# Patient Record
Sex: Female | Born: 1985
Health system: Southern US, Community
[De-identification: ages and names within clinical notes are randomized; demographics above are authoritative.]

## PROBLEM LIST (undated history)

## (undated) DIAGNOSIS — M419 Scoliosis, unspecified: Secondary | ICD-10-CM

## (undated) DIAGNOSIS — R4020 Unspecified coma: Secondary | ICD-10-CM

## (undated) DIAGNOSIS — E162 Hypoglycemia, unspecified: Secondary | ICD-10-CM

## (undated) DIAGNOSIS — J45909 Unspecified asthma, uncomplicated: Secondary | ICD-10-CM

## (undated) DIAGNOSIS — E7800 Pure hypercholesterolemia, unspecified: Secondary | ICD-10-CM

## (undated) HISTORY — PX: GALLBLADDER SURGERY: SHX652

## (undated) HISTORY — PX: WISDOM TOOTH EXTRACTION: SHX21

## (undated) HISTORY — PX: TONSILLECTOMY: SUR1361

## (undated) HISTORY — DX: Pure hypercholesterolemia, unspecified: E78.00

## (undated) HISTORY — PX: ABDOMINAL HYSTERECTOMY: SHX81

## (undated) HISTORY — DX: Unspecified coma: R40.20

---

## 2017-07-21 ENCOUNTER — Encounter (HOSPITAL_COMMUNITY): Payer: Self-pay | Admitting: Family Medicine

## 2017-07-21 ENCOUNTER — Ambulatory Visit (HOSPITAL_COMMUNITY)
Admission: EM | Admit: 2017-07-21 | Discharge: 2017-07-21 | Disposition: A | Discharge status: Home / Self Care | Payer: BLUE CROSS/BLUE SHIELD | Attending: Family Medicine | Admitting: Family Medicine

## 2017-07-21 DIAGNOSIS — M79661 Pain in right lower leg: Secondary | ICD-10-CM | POA: Diagnosis not present

## 2017-07-21 NOTE — ED Provider Notes (Signed)
[facility]    CSN: [id number] Arrival date & time: 07/21/17  1121     History   Chief Complaint Chief Complaint  Patient presents with  . Leg Pain    HPI Anne Eaton is a 32 y.o. female no significant past medical history presenting today with right calf pain.  States that she woke up yesterday morning with sharp pain that feels like a scalpel is running down the side of her calf.  She is also endorsing swelling.  Did feel like it was increasingly warm last night, but denies any redness.  Denies history of previous clot.  Does note that she has had issues with her right leg off and on since she was injured on the job.  Occasionally she will have right knee swelling without pain that resolves on its.  She denies any injury prior to onset of calf pain, denies any recent exercise.  Feels this pain is different from a muscle soreness.  Patient does have a family history of DVTs in her grandmother.  Patient is a smoker.  Denies birth control use, personal history of cancer, recent surgery, immobilization.   HPI  History reviewed. No pertinent past medical history.  There are no active problems to display for this patient.   History reviewed. No pertinent surgical history.  OB History    No data available       Home Medications    Prior to Admission medications   Not on File    Family History History reviewed. No pertinent family history.  Social History Social History   Tobacco Use  . Smoking status: Current Every Day Smoker  . Smokeless tobacco: Former Engineer, water Use Topics  . Alcohol use: Not on file  . Drug use: Not on file     Allergies   Patient has no known allergies.   Review of Systems Review of Systems  Constitutional: Negative for activity change, chills and fever.  Respiratory: Negative for cough and shortness of breath.   Cardiovascular: Positive for leg swelling. Negative for chest pain and palpitations.  Gastrointestinal:  Negative for abdominal pain, nausea and vomiting.  Musculoskeletal: Positive for myalgias. Negative for arthralgias and back pain.  Skin: Negative for color change and rash.  Neurological: Negative for dizziness, syncope, weakness and headaches.  All other systems reviewed and are negative.    Physical Exam Triage Vital Signs ED Triage Vitals  Enc Vitals Group     BP 07/21/17 1219 (!) 133/49     Pulse Rate 07/21/17 1219 83     Resp 07/21/17 1219 18     Temp 07/21/17 1219 98.5 F (36.9 C)     Temp src --      SpO2 07/21/17 1219 100 %     Weight --      Height --      Head Circumference --      Peak Flow --      Pain Score 07/21/17 1217 5     Pain Loc --      Pain Edu? --      Excl. in GC? --    No data found.  Updated Vital Signs BP (!) 133/49   Pulse 83   Temp 98.5 F (36.9 C)   Resp 18   SpO2 100%   Visual Acuity Right Eye Distance:   Left Eye Distance:   Bilateral Distance:    Right Eye Near:   Left Eye Near:    Bilateral  Near:     Physical Exam  Constitutional: She appears well-developed and well-nourished. No distress.  HENT:  Head: Normocephalic and atraumatic.  Eyes: Conjunctivae are normal.  Neck: Neck supple.  Cardiovascular: Normal rate.  Pulmonary/Chest: Effort normal. No respiratory distress.  Musculoskeletal: She exhibits no edema.  Mild swelling to right calf, tenderness to palpation of medial aspect of calf and linear distribution towards ankle.  No erythema or palpable cord.  Nontender to palpation of the knee or ankle.  Dorsalis pedis 2+.  Toes are cold, cap refill less than 2 seconds.  Neurological: She is alert.  Skin: Skin is warm and dry.  Psychiatric: She has a normal mood and affect.  Nursing note and vitals reviewed.    UC Treatments / Results  Labs (all labs ordered are listed, but only abnormal results are displayed) Labs Reviewed - No data to display  EKG  EKG Interpretation None       Radiology No results  found.  Procedures Procedures (including critical care time)  Medications Ordered in UC Medications - No data to display   Initial Impression / Assessment and Plan / UC Course  I have reviewed the triage vital signs and the nursing notes.  Pertinent labs & imaging results that were available during my care of the patient were reviewed by me and considered in my medical decision making (see chart for details).     Given unilateral leg swelling and calf tenderness; risk factors of smoking and family history will send for ultrasound for evaluation for DVT as cause of pain.  Positive was sent to emergency room.  If negative, will treat conservatively with anti-inflammatories and compression. Discussed strict return precautions. Patient verbalized understanding and is agreeable with plan.   Final Clinical Impressions(s) / UC Diagnoses   Final diagnoses:  Right calf pain    ED Discharge Orders        Ordered    VAS US LOWER EXTREMITY VENOUS (DVT)     07/21/17 1306       Controlled Substance Prescriptions [place] Controlled Substance Registry consulted? Not Applicable   Lew DawesWieters, [place] Nunley C, [place]-C 07/21/17 1322

## 2017-07-21 NOTE — ED Notes (Signed)
Called vascular lab-left message

## 2017-07-21 NOTE — Discharge Instructions (Signed)
Please go to the main entrance of [facility] at      and ask for the vascular lab.  If there is a clot- please go to emergency room.   If ultrasound is clear for clot I would take tylenol and/or ibuprofen, use compression dressings. Return if not improving on own in 1-2 weeks, or sooner if worsening and developing other symptoms.

## 2017-07-21 NOTE — ED Notes (Signed)
Patient waiting in lobby for instructions and appt time

## 2017-07-21 NOTE — ED Triage Notes (Addendum)
Pt here for RLE pain in calf and lateral part of leg that started yesterday morning. Hx of easy bruising and knee swelling in the same leg. No strenuous activity. Sitting at home most of the time. Pt has family hx DVT.

## 2017-07-21 NOTE — ED Notes (Signed)
[facility] PT made aware. PT instructed to present to ED for any chest pain or shortness of breath.

## 2017-07-21 NOTE — ED Notes (Signed)
Message left for vascular, waiting for return

## 2017-07-22 ENCOUNTER — Ambulatory Visit (HOSPITAL_COMMUNITY)
Admit: 2017-07-22 | Discharge: 2017-07-22 | Disposition: A | Discharge status: Home / Self Care | Payer: BLUE CROSS/BLUE SHIELD | Attending: Emergency Medicine | Admitting: Emergency Medicine

## 2017-07-22 DIAGNOSIS — M7989 Other specified soft tissue disorders: Secondary | ICD-10-CM | POA: Diagnosis not present

## 2017-07-22 DIAGNOSIS — M79604 Pain in right leg: Secondary | ICD-10-CM | POA: Insufficient documentation

## 2017-07-22 DIAGNOSIS — M79609 Pain in unspecified limb: Secondary | ICD-10-CM

## 2017-07-22 NOTE — Progress Notes (Addendum)
Right lower extremity venous duplex completed. There is no evidence of a DVT, superficial thrombosis, or Baker's cyst. Anne Eaton, RVS 07/22/2017, 10:14 AM

## 2018-04-18 DIAGNOSIS — Z5321 Procedure and treatment not carried out due to patient leaving prior to being seen by health care provider: Secondary | ICD-10-CM | POA: Diagnosis not present

## 2018-04-18 DIAGNOSIS — R1012 Left upper quadrant pain: Secondary | ICD-10-CM | POA: Insufficient documentation

## 2018-04-19 ENCOUNTER — Other Ambulatory Visit: Payer: Self-pay

## 2018-04-19 ENCOUNTER — Emergency Department (HOSPITAL_COMMUNITY)
Admission: EM | Admit: 2018-04-19 | Discharge: 2018-04-19 | Disposition: A | Discharge status: Home / Self Care | Payer: Medicaid Other | Source: Home / Self Care | Attending: Emergency Medicine | Admitting: Emergency Medicine

## 2018-04-19 ENCOUNTER — Encounter (HOSPITAL_COMMUNITY): Payer: Self-pay | Admitting: Emergency Medicine

## 2018-04-19 ENCOUNTER — Emergency Department (HOSPITAL_COMMUNITY)
Admission: EM | Admit: 2018-04-19 | Discharge: 2018-04-19 | Disposition: A | Discharge status: Home / Self Care | Payer: Medicaid Other | Attending: Emergency Medicine | Admitting: Emergency Medicine

## 2018-04-19 ENCOUNTER — Encounter (HOSPITAL_COMMUNITY): Payer: Self-pay

## 2018-04-19 ENCOUNTER — Emergency Department (HOSPITAL_COMMUNITY): Payer: Medicaid Other

## 2018-04-19 DIAGNOSIS — R1012 Left upper quadrant pain: Secondary | ICD-10-CM | POA: Insufficient documentation

## 2018-04-19 DIAGNOSIS — F17210 Nicotine dependence, cigarettes, uncomplicated: Secondary | ICD-10-CM

## 2018-04-19 HISTORY — DX: Unspecified asthma, uncomplicated: J45.909

## 2018-04-19 HISTORY — DX: Hypoglycemia, unspecified: E16.2

## 2018-04-19 LAB — COMPREHENSIVE METABOLIC PANEL
ALK PHOS: 70 U/L (ref 38–126)
ALT: 21 U/L (ref 0–44)
AST: 4 U/L — ABNORMAL LOW (ref 15–41)
Albumin: 3.9 g/dL (ref 3.5–5.0)
Anion gap: 6 (ref 5–15)
BILIRUBIN TOTAL: 0.5 mg/dL (ref 0.3–1.2)
BUN: 15 mg/dL (ref 6–20)
CALCIUM: 9.1 mg/dL (ref 8.9–10.3)
CO2: 25 mmol/L (ref 22–32)
Chloride: 107 mmol/L (ref 98–111)
Creatinine, Ser: 0.66 mg/dL (ref 0.44–1.00)
GFR calc Af Amer: >60 mL/min (ref >60)
Glucose, Bld: 110 mg/dL — ABNORMAL HIGH (ref 70–99)
POTASSIUM: 3.7 mmol/L (ref 3.5–5.1)
Sodium: 138 mmol/L (ref 135–145)
TOTAL PROTEIN: 7 g/dL (ref 6.5–8.1)

## 2018-04-19 LAB — I-STAT BETA HCG BLOOD, ED (MC, WL, AP ONLY)

## 2018-04-19 LAB — CBC
HCT: 44.2 % (ref 36.0–46.0)
Hemoglobin: 14.6 g/dL (ref 12.0–15.0)
MCH: 31.9 pg (ref 26.0–34.0)
MCHC: 33 g/dL (ref 30.0–36.0)
MCV: 96.7 fL (ref 80.0–100.0)
PLATELETS: 309 10*3/uL (ref 150–400)
RBC: 4.57 MIL/uL (ref 3.87–5.11)
RDW: 12.1 % (ref 11.5–15.5)
WBC: 11 10*3/uL — AB (ref 4.0–10.5)
nRBC: 0 % (ref 0.0–0.2)

## 2018-04-19 LAB — URINALYSIS, ROUTINE W REFLEX MICROSCOPIC
BILIRUBIN URINE: NEGATIVE
Glucose, UA: NEGATIVE mg/dL
Hgb urine dipstick: NEGATIVE
KETONES UR: NEGATIVE mg/dL
Leukocytes, UA: NEGATIVE
NITRITE: NEGATIVE
Protein, ur: NEGATIVE mg/dL
Specific Gravity, Urine: 1.01 (ref 1.005–1.030)
pH: 5 (ref 5.0–8.0)

## 2018-04-19 LAB — LIPASE, BLOOD: Lipase: 45 U/L (ref 11–51)

## 2018-04-19 MED ORDER — LORAZEPAM 2 MG/ML IJ SOLN
1.0000 mg | Freq: Once | INTRAMUSCULAR | Status: AC
  Administered 2018-04-19: 1 mg via INTRAVENOUS
  Filled 2018-04-19: qty 1

## 2018-04-19 MED ORDER — PANTOPRAZOLE SODIUM 40 MG PO TBEC: 40.0000 mg | DELAYED_RELEASE_TABLET | Freq: Every day | ORAL | 0 refills | Status: DC

## 2018-04-19 MED ORDER — PANTOPRAZOLE SODIUM 40 MG IV SOLR
40.0000 mg | Freq: Once | INTRAVENOUS | Status: AC
  Administered 2018-04-19: 40 mg via INTRAVENOUS
  Filled 2018-04-19: qty 40

## 2018-04-19 MED ORDER — IOHEXOL 300 MG/ML  SOLN
100.0000 mL | Freq: Once | INTRAMUSCULAR | Status: AC | PRN
  Administered 2018-04-19: 100 mL via INTRAVENOUS

## 2018-04-19 MED ORDER — FENTANYL CITRATE (PF) 100 MCG/2ML IJ SOLN
100.0000 ug | INTRAMUSCULAR | Status: DC | PRN
  Administered 2018-04-19: 100 ug via INTRAVENOUS
  Filled 2018-04-19: qty 2

## 2018-04-19 MED ORDER — SODIUM CHLORIDE 0.9 % IV SOLN
INTRAVENOUS | Status: DC
  Administered 2018-04-19: 12:00:00 via INTRAVENOUS

## 2018-04-19 MED ORDER — KETOROLAC TROMETHAMINE 30 MG/ML IJ SOLN
15.0000 mg | Freq: Once | INTRAMUSCULAR | Status: AC
  Administered 2018-04-19: 15 mg via INTRAVENOUS
  Filled 2018-04-19: qty 1

## 2018-04-19 MED ORDER — SODIUM CHLORIDE 0.9 % IV BOLUS
500.0000 mL | Freq: Once | INTRAVENOUS | Status: AC
  Administered 2018-04-19: 500 mL via INTRAVENOUS

## 2018-04-19 NOTE — ED Triage Notes (Signed)
Pt states sharp left ULQ underneath rib radiated up back, down side, and cannot take deep breaths that started approx 3 hours ago. Has taken Prilosec and zantac approx 3 hours ago with no relief.

## 2018-04-19 NOTE — ED Triage Notes (Signed)
Patient here for evaluation for LUQ radiating to left chest and breast. States hard to take deep inspirations. Happened in the past associated with acid reflux. Seen at [facility] ED however states left without being seen due to waiting.

## 2018-04-19 NOTE — Discharge Instructions (Addendum)
Use Tylenol as needed for pain.  We are prescribing a proton pump inhibitor to decrease acid secretion.  It would likely help to take an antacid like Maalox or Tums, before meals and at bedtime for a week or 2, until this medicine start to work.  Follow-up with your doctor of your choice for further evaluation and treatment, as needed.

## 2018-04-19 NOTE — ED Notes (Signed)
Patient verbalizes understanding of discharge instructions. Opportunity for questioning and answers were provided. 

## 2018-04-19 NOTE — ED Notes (Signed)
Rn went into room to start an iv on pt and draw blood work, pt sitting on side of stretcher, refused to have iv started, did agree to having blood work performed, Charity fundraiserN explained that I would try to draw blood work and iv in one stick but pt still refused,

## 2018-04-19 NOTE — ED Provider Notes (Addendum)
[facility]   CSN: [id number] Arrival date & time: 04/19/18  [id number]     History   Chief Complaint Chief Complaint  Patient presents with  . Chest Pain  . Abdominal Pain  . Shortness of Breath    HPI Anne Eaton is a 32 y.o. female.  HPI   She presents for evaluation of left upper quadrant abdominal pain which started last night after eating dinner.  She went to [facility] emergency department last night, had screening labs ordered, but was not seen by provider before she left before evaluation.  She is sad to come here today because the pain persisted.  She has had similar pain in the past.  She had a bowel movement at 10:30 PM last night that was normal.  She denies cough, shortness of breath, chest pain, weakness or dizziness.  There are no other no modifying factors.  Past Medical History:  Diagnosis Date  . Asthma   . Hypoglycemia     There are no active problems to display for this patient.   Past Surgical History:  Procedure Laterality Date  . ABDOMINAL HYSTERECTOMY    . CESAREAN SECTION    . GALLBLADDER SURGERY    . TONSILLECTOMY    . WISDOM TOOTH EXTRACTION       OB History   None      Home Medications    Prior to Admission medications   Medication Sig Start Date End Date Taking? Authorizing Provider  pantoprazole (PROTONIX) 40 MG tablet Take 1 tablet (40 mg total) by mouth daily. 04/19/18   Mancel BaleWentz, Dahl Higinbotham, MD    Family History No family history on file.  Social History Social History   Tobacco Use  . Smoking status: Current Every Day Smoker    Packs/day: 1.00  . Smokeless tobacco: Former Engineer, waterUser  Substance Use Topics  . Alcohol use: Yes    Comment: occ  . Drug use: Never     Allergies   Augmentin [amoxicillin-pot clavulanate] and Morphine and related   Review of Systems Review of Systems  All other systems reviewed and are negative.    Physical Exam Updated Vital Signs BP  112/78   Pulse 68   Temp 98.1 F (36.7 C) (Oral)   Resp 18   Ht 5\' 5"  (1.651 m)   Wt 88.5 kg   SpO2 95%   BMI 32.45 kg/m   Physical Exam Vitals signs and nursing note reviewed.  Constitutional:      Appearance: She is well-developed. She is not ill-appearing or diaphoretic.  HENT:     Head: Normocephalic and atraumatic.  Eyes:     Conjunctiva/sclera: Conjunctivae normal.     Pupils: Pupils are equal, round, and reactive to light.  Neck:     Musculoskeletal: Normal range of motion and neck supple.     Trachea: Phonation normal.  Cardiovascular:     Rate and Rhythm: Normal rate and regular rhythm.  Pulmonary:     Effort: Pulmonary effort is normal.     Breath sounds: Normal breath sounds.  Chest:     Chest wall: No tenderness.  Abdominal:     General: There is no distension.     Palpations: Abdomen is soft.     Tenderness: There is abdominal tenderness (LLQ, mild.). There is no guarding or rebound.     Hernia: No hernia is present.  Musculoskeletal: Normal range of motion.  Skin:    General: Skin is  warm and dry.  Neurological:     Mental Status: She is alert and oriented to person, place, and time.     Motor: No abnormal muscle tone.  Psychiatric:        Behavior: Behavior normal.        Thought Content: Thought content normal.        Judgment: Judgment normal.      ED Treatments / Results  Labs (all labs ordered are listed, but only abnormal results are displayed) Labs Reviewed  URINALYSIS, ROUTINE W REFLEX MICROSCOPIC  I-STAT BETA HCG BLOOD, ED ([facility], [facility], [facility] ONLY)    EKG EKG Interpretation  Date/Time:  Monday April 19 2018 09:50:01 EST Ventricular Rate:  76 PR Interval:    QRS Duration: 77 QT Interval:  376 QTC Calculation: 423 R Axis:   65 Text Interpretation:  Ectopic atrial rhythm No previous ECGs available Confirmed by Mancel Bale [phone]) on 04/19/2018 1:22:57 PM   Radiology Dg Chest 2 View  Result Date: 04/19/2018 CLINICAL DATA:  Acute  left upper quadrant abdominal pain. EXAM: CHEST - 2 VIEW COMPARISON:  None. FINDINGS: The heart size and mediastinal contours are within normal limits. Both lungs are clear. No pneumothorax or pleural effusion is noted. The visualized skeletal structures are unremarkable. IMPRESSION: No active cardiopulmonary disease. Electronically Signed   By: Lupita Raider, M.D.   On: 04/19/2018 10:48   Ct Abdomen Pelvis W Contrast  Result Date: 04/19/2018 CLINICAL DATA:  Left-sided abdominal pain for several hours EXAM: CT ABDOMEN AND PELVIS WITH CONTRAST TECHNIQUE: Multidetector CT imaging of the abdomen and pelvis was performed using the standard protocol following bolus administration of intravenous contrast. CONTRAST:  OMNIPAQUE IOHEXOL 300 MG/ML  SOLN COMPARISON:  None. FINDINGS: Lower chest: Mild bibasilar atelectasis is noted left slightly greater than right. Hepatobiliary: No focal liver abnormality is seen. Status post cholecystectomy. No biliary dilatation. Pancreas: Unremarkable. No pancreatic ductal dilatation or surrounding inflammatory changes. Spleen: Normal in size without focal abnormality. Adrenals/Urinary Tract: The adrenal glands are within normal limits. Kidneys are well visualized without renal calculi or obstructive changes. No ureteral stones are seen. The bladder is decompressed. Stomach/Bowel: Stomach is within normal limits. Appendix appears normal. No evidence of bowel wall thickening, distention, or inflammatory changes. Vascular/Lymphatic: No significant vascular findings are present. No enlarged abdominal or pelvic lymph nodes. Reproductive: Status post hysterectomy. No adnexal masses. Other: No abdominal wall hernia or abnormality. No abdominopelvic ascites. Musculoskeletal: No acute or significant osseous findings. IMPRESSION: Mild bibasilar atelectasis left greater than right. No other focal abnormality is noted. Electronically Signed   By: Alcide Clever M.D.   On: 04/19/2018 11:40     Procedures Procedures (including critical care time)  Medications Ordered in ED Medications  0.9 %  sodium chloride infusion ( Intravenous Stopped 04/19/18 1557)  fentaNYL (SUBLIMAZE) injection 100 mcg (100 mcg Intravenous Given 04/19/18 1015)  sodium chloride 0.9 % bolus 500 mL (0 mLs Intravenous Stopped 04/19/18 1124)  pantoprazole (PROTONIX) injection 40 mg (40 mg Intravenous Given 04/19/18 1018)  iohexol (OMNIPAQUE) 300 MG/ML solution 100 mL (100 mLs Intravenous Contrast Given 04/19/18 1104)  LORazepam (ATIVAN) injection 1 mg (1 mg Intravenous Given 04/19/18 1353)  ketorolac (TORADOL) 30 MG/ML injection 15 mg (15 mg Intravenous Given 04/19/18 1353)     Initial Impression / Assessment and Plan / ED Course  I have reviewed the triage vital signs and the nursing notes.  Pertinent labs & imaging results that were available during my care of  the patient were reviewed by me and considered in my medical decision making (see chart for details).  Clinical Course as of Apr 19 1852  Mon Apr 19, 2018  1323 Normal  I-Stat beta hCG blood, ED [EW]  1323 Normal  Urinalysis, Routine w reflex microscopic [EW]  1323 No acute abnormality, images reviewed by me  CT Abdomen Pelvis W Contrast [EW]  1324 No infiltrate or CHF, images reviewed by me  DG Chest 2 View [EW]  1334 She states she had no relief of her pain after fentanyl was given.  She would like to try something else.  She continues to have left upper quadrant abdominal pain.   [EW]    Clinical Course User Index [EW] Mancel Bale, MD     Patient Vitals for the past 24 hrs:  BP Temp Temp src Pulse Resp SpO2 Height Weight  04/19/18 1516 112/78 - - 68 18 95 % - -  04/19/18 1100 - - - 70 20 94 % - -  04/19/18 1045 - - - 64 (!) 25 96 % - -  04/19/18 1030 - - - 70 14 94 % - -  04/19/18 1015 - - - 61 16 99 % - -  04/19/18 1000 - - - 73 19 94 % - -  04/19/18 0951 117/61 98.1 F (36.7 C) Oral 68 15 96 % 5\' 5"  (1.651 m) 88.5 kg     3:48 PM Reevaluation with update and discussion. After initial assessment and treatment, an updated evaluation reveals she is now comfortable after additional treatment and ready to go home.  Findings discussed with the patient all questions were answered. Mancel Bale   Medical Decision Making: Nonspecific abdominal pain reassuring ED evaluation.  No evidence for colitis, urinary tract infection, pancreatitis, serious bacterial infection or impending vascular collapse.  Possible GERD.  CRITICAL CARE-no Performed by: Mancel Bale   Nursing Notes Reviewed/ Care Coordinated Applicable Imaging Reviewed Interpretation of Laboratory Data incorporated into ED treatment  The patient appears reasonably screened and/or stabilized for discharge and I doubt any other medical condition or other [facility] requiring further screening, evaluation, or treatment in the ED at this time prior to discharge.  Plan: Home Medications-antacid of choice, for 1 or 2 weeks; Home Treatments-gradually advance diet; return here if the recommended treatment, does not improve the symptoms; Recommended follow up-PCP of choice as needed.     Final Clinical Impressions(s) / ED Diagnoses   Final diagnoses:  Left upper quadrant pain    ED Discharge Orders         Ordered    pantoprazole (PROTONIX) 40 MG tablet  Daily     04/19/18 1546           Mancel Bale, MD 04/19/18 Herbie Baltimore    Mancel Bale, MD 04/27/18 1249

## 2018-04-19 NOTE — ED Notes (Signed)
Pt stated she was unwilling to wait on reevaluation when informed MD was tied up with a critical patient. Stated she was in the room for 2 hours waiting and 'this is fucking ridiculous'. Pt told RN to 'kiss her ass and to tell the doctor to kiss her ass' as well. Left AMA with no reevaluation or discharge instructions.

## 2018-04-19 NOTE — ED Notes (Signed)
Pt c/o left upper quad abd pain that started tonight after eating spaghetti, pt states that she has had the pain numerous times in the past with the last episode several months ago,

## 2018-04-20 ENCOUNTER — Ambulatory Visit: Payer: Self-pay | Admitting: *Deleted

## 2018-04-20 NOTE — Telephone Encounter (Signed)
Pt seen in ED yesterday for left upper quad abdominal pain. Sudden onset yesterday, 10/10. States pain is "No better." Reports SOB. ED report reads:  Mild bibasilar atelectasis left greater than right. No other focal abnormality is noted.  WBCs 11.0 Pt discharged with Protonix, which she states she has been taking.    Pt tearful during call. States, "Awake all night with pain." Pt directed back to ED. Verbalizes understanding.  Prior to triage, agent secured new patient appt. with Dr. Creta LevinStallings for 06/04/17, placed on wait list.   Reason for Disposition . [1] SEVERE pain (e.g., excruciating) AND [2] present > 1 hour  Answer Assessment - Initial Assessment Questions 1. LOCATION: "Where does it hurt?"      Left upper quad of abdomen, "Right below rib cage" 2. RADIATION: "Does the pain shoot anywhere else?" (e.g., chest, back)     *BAck 3. ONSET: "When did the pain begin?" (e.g., minutes, hours or days ago)     Yesterday 4. SUDDEN: "Gradual or sudden onset?"     Sudden 5. PATTERN "Does the pain come and go, or is it constant?"    - If constant: "Is it getting better, staying the same, or worsening?"      (Note: Constant means the pain never goes away completely; most serious pain is constant and it progresses)     - If intermittent: "How long does it last?" "Do you have pain now?"     (Note: Intermittent means the pain goes away completely between bouts)     Constant 6. SEVERITY: "How bad is the pain?"  (e.g., Scale 1-10; mild, moderate, or severe)    - MILD (1-3): doesn't interfere with normal activities, abdomen soft and not tender to touch     - MODERATE (4-7): interferes with normal activities or awakens from sleep, tender to touch     - SEVERE (8-10): excruciating pain, doubled over, unable to do any normal activities       10/10 7. RECURRENT SYMPTOM: "Have you ever had this type of abdominal pain before?" If so, ask: "When was the last time?" and "What happened that time?"      Seen  in ED yesterday 8. AGGRAVATING FACTORS: "Does anything seem to cause this pain?" (e.g., foods, stress, alcohol)      9. CARDIAC SYMPTOMS: "Do you have any of the following symptoms: chest pain, difficulty breathing, sweating, nausea?"      10. OTHER SYMPTOMS: "Do you have any other symptoms?" (e.g., fever, vomiting, diarrhea)       SOB 11. PREGNANCY: "Is there any chance you are pregnant?" "When was your last menstrual period?"  Protocols used: ABDOMINAL PAIN - UPPER-A-AH

## 2018-04-21 NOTE — Telephone Encounter (Signed)
FYI

## 2018-05-26 ENCOUNTER — Encounter (HOSPITAL_COMMUNITY): Payer: Self-pay

## 2018-05-26 ENCOUNTER — Emergency Department (HOSPITAL_COMMUNITY)
Admission: EM | Admit: 2018-05-26 | Discharge: 2018-05-26 | Disposition: A | Discharge status: Home / Self Care | Payer: Medicaid Other | Attending: Emergency Medicine | Admitting: Emergency Medicine

## 2018-05-26 ENCOUNTER — Other Ambulatory Visit: Payer: Self-pay

## 2018-05-26 ENCOUNTER — Ambulatory Visit (HOSPITAL_COMMUNITY)
Admission: EM | Admit: 2018-05-26 | Discharge: 2018-05-26 | Disposition: A | Discharge status: Other Acute Inpatient Hospital | Payer: Self-pay | Source: Ambulatory Visit

## 2018-05-26 ENCOUNTER — Emergency Department (HOSPITAL_COMMUNITY): Payer: Medicaid Other

## 2018-05-26 DIAGNOSIS — J45909 Unspecified asthma, uncomplicated: Secondary | ICD-10-CM | POA: Diagnosis not present

## 2018-05-26 DIAGNOSIS — R55 Syncope and collapse: Secondary | ICD-10-CM | POA: Diagnosis not present

## 2018-05-26 DIAGNOSIS — Z79899 Other long term (current) drug therapy: Secondary | ICD-10-CM | POA: Diagnosis not present

## 2018-05-26 DIAGNOSIS — R072 Precordial pain: Secondary | ICD-10-CM | POA: Insufficient documentation

## 2018-05-26 DIAGNOSIS — F17210 Nicotine dependence, cigarettes, uncomplicated: Secondary | ICD-10-CM | POA: Diagnosis not present

## 2018-05-26 DIAGNOSIS — R51 Headache: Secondary | ICD-10-CM | POA: Diagnosis present

## 2018-05-26 LAB — CBC WITH DIFFERENTIAL/PLATELET
ABS IMMATURE GRANULOCYTES: 0.02 10*3/uL (ref 0.00–0.07)
Basophils Absolute: 0.1 10*3/uL (ref 0.0–0.1)
Basophils Relative: 1 %
Eosinophils Absolute: 0.2 10*3/uL (ref 0.0–0.5)
Eosinophils Relative: 2 %
HEMATOCRIT: 48.7 % — AB (ref 36.0–46.0)
HEMOGLOBIN: 15.9 g/dL — AB (ref 12.0–15.0)
IMMATURE GRANULOCYTES: 0 %
LYMPHS ABS: 2.6 10*3/uL (ref 0.7–4.0)
LYMPHS PCT: 27 %
MCH: 31.1 pg (ref 26.0–34.0)
MCHC: 32.6 g/dL (ref 30.0–36.0)
MCV: 95.3 fL (ref 80.0–100.0)
MONOS PCT: 5 %
Monocytes Absolute: 0.5 10*3/uL (ref 0.1–1.0)
NEUTROS ABS: 6.5 10*3/uL (ref 1.7–7.7)
Neutrophils Relative %: 65 %
PLATELETS: 310 10*3/uL (ref 150–400)
RBC: 5.11 MIL/uL (ref 3.87–5.11)
RDW: 11.9 % (ref 11.5–15.5)
WBC: 9.9 10*3/uL (ref 4.0–10.5)
nRBC: 0 % (ref 0.0–0.2)

## 2018-05-26 LAB — I-STAT BETA HCG BLOOD, ED (MC, WL, AP ONLY): I-stat hCG, quantitative: <5 m[IU]/mL (ref <5)

## 2018-05-26 LAB — COMPREHENSIVE METABOLIC PANEL
ALBUMIN: 3.8 g/dL (ref 3.5–5.0)
ALK PHOS: 68 U/L (ref 38–126)
ALT: 23 U/L (ref 0–44)
Anion gap: 11 (ref 5–15)
BILIRUBIN TOTAL: 0.9 mg/dL (ref 0.3–1.2)
BUN: 14 mg/dL (ref 6–20)
CALCIUM: 9.2 mg/dL (ref 8.9–10.3)
CO2: 20 mmol/L — ABNORMAL LOW (ref 22–32)
Chloride: 109 mmol/L (ref 98–111)
Creatinine, Ser: 0.7 mg/dL (ref 0.44–1.00)
GFR calc Af Amer: >60 mL/min (ref >60)
GLUCOSE: 103 mg/dL — AB (ref 70–99)
Potassium: 3.7 mmol/L (ref 3.5–5.1)
Sodium: 140 mmol/L (ref 135–145)
TOTAL PROTEIN: 7.3 g/dL (ref 6.5–8.1)

## 2018-05-26 LAB — URINALYSIS, ROUTINE W REFLEX MICROSCOPIC
BILIRUBIN URINE: NEGATIVE
Glucose, UA: NEGATIVE mg/dL
HGB URINE DIPSTICK: NEGATIVE
Ketones, ur: NEGATIVE mg/dL
Leukocytes, UA: NEGATIVE
Nitrite: NEGATIVE
Protein, ur: NEGATIVE mg/dL
SPECIFIC GRAVITY, URINE: 1.032 — AB (ref 1.005–1.030)
pH: 5 (ref 5.0–8.0)

## 2018-05-26 LAB — PROTIME-INR
INR: 0.96
Prothrombin Time: 12.7 seconds (ref 11.4–15.2)

## 2018-05-26 LAB — SEDIMENTATION RATE: Sed Rate: 11 mm/hr (ref 0–22)

## 2018-05-26 LAB — I-STAT TROPONIN, ED
Troponin i, poc: 0 ng/mL (ref 0.00–0.08)
Troponin i, poc: 0 ng/mL (ref 0.00–0.08)

## 2018-05-26 LAB — RAPID URINE DRUG SCREEN, HOSP PERFORMED
AMPHETAMINES: NOT DETECTED
BENZODIAZEPINES: NOT DETECTED
Barbiturates: NOT DETECTED
Cocaine: NOT DETECTED
Opiates: NOT DETECTED
TETRAHYDROCANNABINOL: NOT DETECTED

## 2018-05-26 LAB — D-DIMER, QUANTITATIVE: D-Dimer, Quant: 0.7 ug/mL-FEU — ABNORMAL HIGH (ref 0.00–0.50)

## 2018-05-26 LAB — TSH: TSH: 1.154 u[IU]/mL (ref 0.350–4.500)

## 2018-05-26 MED ORDER — IOPAMIDOL (ISOVUE-370) INJECTION 76%
100.0000 mL | Freq: Once | INTRAVENOUS | Status: AC | PRN
  Administered 2018-05-26: 100 mL via INTRAVENOUS

## 2018-05-26 NOTE — ED Provider Notes (Signed)
[facility]   CSN: [id number] Arrival date & time: 05/26/18  [id number]     History   Chief Complaint Chief Complaint  Patient presents with  . Headache    HPI Anne Eaton is a 33 y.o. female.  HPI Patient reports that she has had 3 syncopal episodes in the past 4 days.  She reports that in the first episode she was just waiting in the car to pick up somebody.  She got kind of a fuzzy sensation in her head and lost consciousness.  She denies she had severe headache.  She reports that that first episode happened somewhat with position changes she had gotten out of the car.  She reports however she had another episode while she was at home and was on her computer.  She reports that she believes she lost consciousness for almost 2 hours because she could tell by the time on her computer.  She reports when she awakened, she was on the floor.  She has not had any vomiting or double vision.  She reports she has had ongoing problems with chest pain since December.  It is a sharp pain that occurs on the left side of her chest.  She reports is usually with a deep breath.  She was seen in the emergency department and evaluated at that time.  She reports she does not have any specific diagnosis.  She has no seizure history that she is aware of.  She reports she does not have a severe headache but just gets kind of a general fuzzy or pressure-like sensation in her head.  She denies she has focal weakness numbness or tingling of her extremities.  Patient does smoke half to 1 pack/day.  She denies any alcohol or drug use.  Family history negative for early cardiac disease.  Nobody has history of sudden death or congenital heart problems.  Patient has 1 brother who is generally healthy.  Both parents are living. Past Medical History:  Diagnosis Date  . Asthma   . Hypoglycemia     There are no active problems to display for this patient.   Past  Surgical History:  Procedure Laterality Date  . ABDOMINAL HYSTERECTOMY    . CESAREAN SECTION    . GALLBLADDER SURGERY    . TONSILLECTOMY    . WISDOM TOOTH EXTRACTION       OB History   No obstetric history on file.      Home Medications    Prior to Admission medications   Medication Sig Start Date End Date Taking? Authorizing Provider  acetaminophen (TYLENOL) 500 MG tablet Take 1,000 mg by mouth every 6 (six) hours as needed for mild pain or headache.   Yes [provider]  pantoprazole (PROTONIX) 40 MG tablet Take 1 tablet (40 mg total) by mouth daily. 04/19/18  Yes Mancel BaleWentz, Elliott, MD    Family History No family history on file.  Social History Social History   Tobacco Use  . Smoking status: Current Every Day Smoker    Packs/day: 1.00  . Smokeless tobacco: Former Engineer, waterUser  Substance Use Topics  . Alcohol use: Yes    Comment: occ  . Drug use: Never     Allergies   Augmentin [amoxicillin-pot clavulanate] and Morphine and related   Review of Systems Review of Systems 10 Systems reviewed and are negative for acute change except as noted in the HPI.   Physical Exam Updated Vital Signs BP Marland Kitchen(!)  109/45   Pulse 68   Temp 98 F (36.7 C) (Oral)   Resp 16   SpO2 95%   Physical Exam Constitutional:      Appearance: She is well-developed.  HENT:     Head: Normocephalic and atraumatic.     Nose: Nose normal.     Mouth/Throat:     Mouth: Mucous membranes are moist.     Pharynx: Oropharynx is clear.  Eyes:     Pupils: Pupils are equal, round, and reactive to light.  Neck:     Musculoskeletal: Neck supple.  Cardiovascular:     Rate and Rhythm: Normal rate and regular rhythm.     Heart sounds: Normal heart sounds.  Pulmonary:     Effort: Pulmonary effort is normal.     Breath sounds: Normal breath sounds.  Abdominal:     General: Bowel sounds are normal. There is no distension.     Palpations: Abdomen is soft.     Tenderness: There is no abdominal  tenderness.  Musculoskeletal: Normal range of motion.  Skin:    General: Skin is warm and dry.  Neurological:     General: No focal deficit present.     Mental Status: She is alert and oriented to person, place, and time.     GCS: GCS eye subscore is 4. GCS verbal subscore is 5. GCS motor subscore is 6.     Coordination: Coordination normal.     Comments: Cognitive function is normal.  Speech is clear.  Finger-nose exam normal.  Upper extremity strength 5\5.  No pronator drift.  Lower extremity strength 5\5.  Sensation intact to light touch bilateral extremities.  Psychiatric:        Mood and Affect: Mood normal.      ED Treatments / Results  Labs (all labs ordered are listed, but only abnormal results are displayed) Labs Reviewed  COMPREHENSIVE METABOLIC PANEL - Abnormal; Notable for the following components:      Result Value   CO2 20 (*)    Glucose, Bld 103 (*)    AST <5 (*)    All other components within normal limits  CBC WITH DIFFERENTIAL/PLATELET - Abnormal; Notable for the following components:   Hemoglobin 15.9 (*)    HCT 48.7 (*)    All other components within normal limits  URINALYSIS, ROUTINE W REFLEX MICROSCOPIC - Abnormal; Notable for the following components:   APPearance HAZY (*)    Specific Gravity, Urine 1.032 (*)    All other components within normal limits  D-DIMER, QUANTITATIVE (NOT AT [facility]) - Abnormal; Notable for the following components:   D-Dimer, Quant 0.70 (*)    All other components within normal limits  PROTIME-INR  RAPID URINE DRUG SCREEN, HOSP PERFORMED  TSH  T3, FREE  SEDIMENTATION RATE  I-STAT BETA HCG BLOOD, ED ([facility], [facility], [facility] ONLY)  I-STAT TROPONIN, ED  I-STAT TROPONIN, ED    EKG EKG Interpretation  Date/Time:  Wednesday May 26 2018 09:15:35 EST Ventricular Rate:  86 PR Interval:    QRS Duration: 73 QT Interval:  354 QTC Calculation: 424 R Axis:   78 Text Interpretation:  Ectopic atrial rhythm Borderline short PR interval ST  elev, probable normal early repol pattern Baseline wander in lead(s) V2 simialr to previous Confirmed by Arby Barrette [id number]) on 05/26/2018 1:46:33 PM   Radiology No results found.  Procedures Procedures (including critical care time)  Medications Ordered in ED Medications  iopamidol (ISOVUE-370) 76 % injection 100 mL (100 mLs  Intravenous Contrast Given 05/26/18 1302)     Initial Impression / Assessment and Plan / ED Course  I have reviewed the triage vital signs and the nursing notes.  Pertinent labs & imaging results that were available during my care of the patient were reviewed by me and considered in my medical decision making (see chart for details).    Consult: Have reviewed with cardiology.  EKG reviewed.  No acute findings suspected.  Recommendation for outpatient follow-up.  Patient is alert and appropriate.  She describes several syncopal episodes.  This does sound atypical for dysrhythmia.  CT scan of the chest is ruled out pulmonary embolus or other significant structural etiology.  There is no murmur on cardiac exam.  Patient had an episode whereby she thinks she was unconscious for several hours.  Diagnostic evaluation within normal limits.  Consideration for possible seizure disorder.  Neurologic examination is normal and patient does not have headache or mental status change.  Patient is counseled about no driving and follow-up with neurology as well as her primary care doctor.  She is also counseled on following up with cardiology to get a Holter monitor placed.  Return precautions reviewed.  Final Clinical Impressions(s) / ED Diagnoses   Final diagnoses:  Syncope and collapse  Precordial pain    ED Discharge Orders    None       Arby Barrette, MD 05/29/18 [phone]

## 2018-05-26 NOTE — ED Triage Notes (Signed)
Per pt: Monday pt passed out 2, almost passed out yesterday.Yesterday and today "I have had fuzziness on the right side of my head that won't go away, it feels like there is a fog. It all started in December when I was here for chest pain and it has only gotten worse." Denies pain "it just a fog".Pt is alert and oriented X 4.

## 2018-05-26 NOTE — ED Notes (Signed)
Pt returned to hallway 20 from CT scan

## 2018-05-26 NOTE — ED Notes (Signed)
Pt wants the nurse to pull from her IV line. She dose not want to be stuck again.

## 2018-05-26 NOTE — ED Notes (Signed)
Pt transported to CT ?

## 2018-05-26 NOTE — ED Notes (Signed)
Patient verbalizes understanding of discharge instructions. Opportunity for questioning and answers were provided. Armband removed by staff, pt discharged from ED.  

## 2018-05-27 LAB — T3, FREE: T3, Free: 3.2 pg/mL (ref 2.0–4.4)

## 2018-06-03 ENCOUNTER — Encounter: Payer: Self-pay | Admitting: Cardiovascular Disease

## 2018-06-04 ENCOUNTER — Encounter

## 2018-06-04 ENCOUNTER — Ambulatory Visit: Payer: Self-pay | Admitting: Family Medicine

## 2018-06-16 NOTE — Progress Notes (Signed)
Cardiology Office Note   Date:  06/23/2018   ID:  Anne Eaton, DOB Sep 12, 1985, MRN [id number]  PCP:  Bing NeighborsHarris, Kimberly S, FNP  Cardiologist:   Charlton HawsPeter Caedence Snowden, MD   No chief complaint on file.     History of Present Illness: Anne Eaton is a 33 y.o. female who presents for consultation regarding syncope Referred by Clyda HurdleMarcy Pfeiffer Martell Reviewed ER notes from 1/15 and 04/19/18 First episode in car waiting to pick someone up. Had fuzzy feeling in head and "lost consciousness Another episode while at computer at home Thinks she was out for 2 hours. Did not hurt herself  She has Had atypical sharp left sided chest pain since December Worse with deep breathing No history of seizures or head trauma No  High risk family cardiac history She is a current every day smoker She did not get CT/MRI of head CT chest negative for PE and normal aortic root She was supposed to f/u with neurology. 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 Asthma   . Hypoglycemia     Past Surgical History:  Procedure Laterality Date  . ABDOMINAL HYSTERECTOMY    . CESAREAN SECTION    . GALLBLADDER SURGERY    . TONSILLECTOMY    . WISDOM TOOTH EXTRACTION       Current Outpatient Medications  Medication Sig Dispense Refill  . acetaminophen (TYLENOL) 500 MG tablet Take 1,000 mg by mouth every 6 (six) hours as needed for mild pain or headache.     No current facility-administered medications for this visit.     Allergies:   Augmentin [amoxicillin-pot clavulanate] and Morphine and related    Social History:  The patient  reports that she has been smoking. She has been smoking about 1.00 pack  per day. She has quit using smokeless tobacco. She reports current alcohol use. She reports that she does not use drugs.   Family History:  The patient's family history is not on file.    ROS:  Please see the history of present illness.   Otherwise, review of systems are positive for none.   All other systems are reviewed and negative.    PHYSICAL EXAM: VS:  BP 128/64   Pulse (!) 106   Ht 5\' 5"  (1.651 m)   Wt 198 lb (89.8 kg)   SpO2 94%   BMI 32.95 kg/m  , BMI Body mass index is 32.95 kg/m. Affect appropriate Healthy:  appears stated age HEENT: normal Neck supple with no adenopathy JVP normal no bruits no thyromegaly Lungs clear with no wheezing and good diaphragmatic motion Heart:  S1/S2 no murmur, no rub, gallop or click PMI normal Abdomen: benighn, BS positve, no tenderness, no AAA no bruit.  No HSM or HJR Distal pulses intact with no bruits No edema Neuro non-focal Skin warm and dry No muscular weakness    EKG:  See HPI   Recent Labs: 05/26/2018: ALT 23; BUN 14; Creatinine, Ser 0.70; Hemoglobin 15.9; Platelets 310; Potassium 3.7; Sodium 140; TSH 1.154    Lipid Panel No results found for: CHOL, TRIG, HDL, CHOLHDL, VLDL, LDLCALC, LDLDIRECT    Wt  Readings from Last 3 Encounters:  06/23/18 198 lb (89.8 kg)  04/19/18 195 lb (88.5 kg)  04/19/18 195 lb (88.5 kg)      Other studies Reviewed: Additional studies/ records that were reviewed today include: Notes from ER labs ECG CTA r/o PE .    ASSESSMENT AND PLAN:  1.  Syncope:  This is highly unlikely to be cardiac given lack of antecedent palpitations or cardiac symptoms normal ECG And exam and prolonged nature of symptoms with no ill effects. Will do TTE to r/o structural heart disease and event monitor She needs to have CT/MRI head and f/u with neurology for possible EEG 2. Smoking counseled on cessation for less than 10 minutes lung parenchyma ok on CT 05/26/18 3. GERD:  Continue protonix and low carb diet      Current medicines are reviewed at length with the patient today.  The patient does not have concerns regarding medicines.  The following changes have been made:  no change  Labs/ tests ordered today include: Event monitor and TTE   Orders Placed This Encounter  Procedures  . CARDIAC EVENT MONITOR  . ECHOCARDIOGRAM COMPLETE     Disposition:   FU with cardiology PRN      Signed, Charlton Haws, MD  06/23/2018 10:06 AM    [facility] [address] [place], [place], [place]  [address] Phone: [phone]; Fax: [phone]

## 2018-06-23 ENCOUNTER — Ambulatory Visit (INDEPENDENT_AMBULATORY_CARE_PROVIDER_SITE_OTHER): Payer: Medicaid Other | Admitting: Cardiovascular Disease

## 2018-06-23 ENCOUNTER — Encounter: Payer: Self-pay | Admitting: Cardiovascular Disease

## 2018-06-23 VITALS — BP 128/64 | HR 106 | Ht 65.0 in | Wt 198.0 lb

## 2018-06-23 DIAGNOSIS — R0600 Dyspnea, unspecified: Secondary | ICD-10-CM

## 2018-06-23 NOTE — Patient Instructions (Addendum)
Medication Instructions:   If you need a refill on your cardiac medications before your next appointment, please call your pharmacy.   Lab work: If you have labs (blood work) drawn today and your tests are completely normal, you will receive your results only by: . MyChart Message (if you have MyChart) OR . A paper copy in the mail If you have any lab test that is abnormal or we need to change your treatment, we will call you to review the results.  Testing/Procedures: Your physician has requested that you have an echocardiogram. Echocardiography is a painless test that uses sound waves to create images of your heart. It provides your doctor with information about the size and shape of your heart and how well your heart's chambers and valves are working. This procedure takes approximately one hour. There are no restrictions for this procedure.  Your physician has recommended that you wear an event monitor. Event monitors are medical devices that record the heart's electrical activity. Doctors most often us these monitors to diagnose arrhythmias. Arrhythmias are problems with the speed or rhythm of the heartbeat. The monitor is a small, portable device. You can wear one while you do your normal daily activities. This is usually used to diagnose what is causing palpitations/syncope (passing out).  Follow-Up: At [facility], you and your health needs are our priority.  As part of our continuing mission to provide you with exceptional heart care, we have created designated Provider Care Teams.  These Care Teams include your primary Cardiologist (physician) and Advanced Practice Providers (APPs -  Physician Assistants and Nurse Practitioners) who all work together to provide you with the care you need, when you need it. Your physician recommends that you schedule a follow-up appointment as needed with Dr. Nishan.     

## 2018-06-29 ENCOUNTER — Encounter: Payer: Self-pay | Admitting: Family Medicine

## 2018-06-29 ENCOUNTER — Ambulatory Visit (INDEPENDENT_AMBULATORY_CARE_PROVIDER_SITE_OTHER): Payer: Medicaid Other | Admitting: Family Medicine

## 2018-06-29 VITALS — BP 105/52 | HR 92 | Resp 17 | Ht 65.0 in | Wt 199.8 lb

## 2018-06-29 DIAGNOSIS — R200 Anesthesia of skin: Secondary | ICD-10-CM

## 2018-06-29 DIAGNOSIS — R0602 Shortness of breath: Secondary | ICD-10-CM

## 2018-06-29 DIAGNOSIS — K219 Gastro-esophageal reflux disease without esophagitis: Secondary | ICD-10-CM

## 2018-06-29 DIAGNOSIS — R55 Syncope and collapse: Secondary | ICD-10-CM

## 2018-06-29 DIAGNOSIS — J45909 Unspecified asthma, uncomplicated: Secondary | ICD-10-CM

## 2018-06-29 DIAGNOSIS — Z7689 Persons encountering health services in other specified circumstances: Secondary | ICD-10-CM

## 2018-06-29 MED ORDER — ALBUTEROL SULFATE HFA 108 (90 BASE) MCG/ACT IN AERS: 2.0000 | INHALATION_SPRAY | RESPIRATORY_TRACT | 1 refills | Status: DC | PRN

## 2018-06-29 MED ORDER — OMEPRAZOLE 40 MG PO CPDR: 40.0000 mg | DELAYED_RELEASE_CAPSULE | Freq: Every day | ORAL | 3 refills | Status: DC

## 2018-06-29 MED ORDER — MONTELUKAST SODIUM 10 MG PO TABS: 10.0000 mg | ORAL_TABLET | Freq: Every day | ORAL | 3 refills | Status: DC

## 2018-06-29 NOTE — Progress Notes (Signed)
Anne Eaton, is a 33 y.o. female  NWG:[id number]:[id number]  [id number]:[id number]  DOB - 1985-06-23  CC:  Chief Complaint  Patient presents with  . Establish Care  . Shortness of Breath       HPI: Anne BeechamCynthia is a 33 y.o. female is here today to establish care.   Anne Eaton does not have a problem list on file.    Today's visit:  ER follow-up-05/26/2018 Patient and her husband reports, patient suffered from multiple syncopal episodes over the course of 3 days. This was a new problem. Patient is unable to tell me what activity she was engaging in prior to passing out. No history of seizure. While in the ER she complained of associated shortness of breath and she reports no chest pain, however was experiencing lower rib pain which she was previously seen at ER for on 04/19/2018. She was referred to cardiology and is upset today as cardiology recommended neurology follow-up as there no evidence of underlying cardiovascular cause of symptoms.  While in the ER she had a mildly elevated D-dimer with a negative CTA. She is still convinced that symptoms could be related to blood clot. She denies leg pain or leg swelling. Shortness of breath is still present. She is a chronic almost 2 pack per day smoker. Reports history of asthma which require inhaler in the past (she thinks albuterol). Other symptoms include persistent left sided numbness and tingling. Rib pain at present has almost resolved without treatment.   GERD She is currently taking protonix for management of acid reflux. Protonix is  not managing symptoms. Reports daily symptoms with each meal regardless of food choices.She  has taken omeprazole in the past which has helped manage and improve acid reflux symptoms. Her symptoms are exacerbated by lying down.  She complains of burning in the mid epigastric region and occasionally feeling as if she is going to vomit.  Current medications:No current outpatient medications on file.   Pertinent family  medical history: family history is not on file.   Allergies  Allergen Reactions  . Augmentin [Amoxicillin-Pot Clavulanate] Other (See Comments)    Yeast infection  . Morphine And Related Other (See Comments)    Made me crazy    Social History   Socioeconomic History  . Marital status: Married    Spouse name: Not on file  . Number of children: 3  . Years of education: Not on file  . Highest education level: Not on file  Occupational History  . Not on file  Social Needs  . Financial resource strain: Not on file  . Food insecurity:    Worry: Not on file    Inability: Not on file  . Transportation needs:    Medical: Not on file    Non-medical: Not on file  Tobacco Use  . Smoking status: Current Every Day Smoker    Packs/day: 1.00  . Smokeless tobacco: Former Engineer, waterUser  Substance and Sexual Activity  . Alcohol use: Yes    Comment: occ  . Drug use: Never  . Sexual activity: Yes  Lifestyle  . Physical activity:    Days per week: Not on file    Minutes per session: Not on file  . Stress: Not on file  Relationships  . Social connections:    Talks on phone: Not on file    Gets together: Not on file    Attends religious service: Not on file    Active member of club or organization: Not on file  Attends meetings of clubs or organizations: Not on file    Relationship status: Not on file  . Intimate partner violence:    Fear of current or ex partner: Not on file    Emotionally abused: Not on file    Physically abused: Not on file    Forced sexual activity: Not on file  Other Topics Concern  . Not on file  Social History Narrative  . Not on file    Review of Systems: Pertinent negatives listed in HPI Objective:   Vitals:   06/29/18 1403  BP: (!) 105/52  Pulse: 92  Resp: 17  SpO2: 95%    BP Readings from Last 3 Encounters:  06/29/18 (!) 105/52  06/23/18 128/64  05/26/18 (!) 109/45    Filed Weights   06/29/18 1403  Weight: 199 lb 12.8 oz (90.6 kg)       Physical Exam: Constitutional: Patient appears well-developed and well-nourished. No distress. HENT: Normocephalic, atraumatic, External right and left ear normal. Oropharynx is clear and moist.  Eyes: Conjunctivae and EOM are normal. PERRLA, no scleral icterus. Neck: Normal ROM. Neck supple. No JVD. No tracheal deviation. No thyromegaly. CVS: RRR, S1/S2 +, no murmurs, no gallops, no carotid bruit.  Pulmonary: Effort and breath sounds normal, no stridor, rhonchi, wheezes, rales.  Abdominal: Soft. BS +, no distension, tenderness, rebound or guarding.  Musculoskeletal: Normal range of motion. No edema and no tenderness.  Neuro: Alert. Normal muscle tone coordination. Normal gait. BUE and BLE strength 5/5. Bilateral hand grips symmetrical. No cranial nerve deficit. Skin: Skin is warm and dry. No rash noted. Not diaphoretic. No erythema. No pallor. Psychiatric: Normal mood and affect. Behavior, judgment, thought content normal.  Lab Results (prior encounters)  Lab Results  Component Value Date   WBC 9.9 05/26/2018   HGB 15.9 (H) 05/26/2018   HCT 48.7 (H) 05/26/2018   MCV 95.3 05/26/2018   PLT 310 05/26/2018   Lab Results  Component Value Date   CREATININE 0.70 05/26/2018   BUN 14 05/26/2018   NA 140 05/26/2018   K 3.7 05/26/2018   CL 109 05/26/2018   CO2 20 (L) 05/26/2018       Assessment and plan:  1. Encounter to establish care  2. Shortness of breath Likely related to underlying asthma/possible COPD Encouraged smoking cessation Start albuterol 2 puffs every 4-6 hours as needed for shortness of breath If no improvement refer to asthma and allergy.  3. Extrinsic asthma without complication, unspecified asthma severity, unspecified whether persistent See #2  4. Syncope, unspecified syncope type, recent history - Ambulatory referral to Neurology, further work-up and evaluation of cause of syncope  5. Left sided numbness and tingling - Ambulatory referral to Neurology,  further work-up and evaluation of cause of symptoms.  6.  GERD Trial Omeprazole 40 mg once daily. If this isn't the correct dose previously prescribed, notify me here in office.  Return for schedule fasting lab within 7 days. Female completed exam patient  6-8 weeks .   The patient was given clear instructions to go to ER or return to medical center if symptoms don't improve, worsen or new problems develop. The patient verbalized understanding. The patient was advised  to call and obtain lab results if they haven't heard anything from out office within 7-10 business days.  Joaquin Courts, FNP Primary Care at [facility] [address] [place] [address] [phone]: [phone]    This note has been created with Dragon speech recognition software and smart  Lobbyist. Any transcriptional errors are unintentional.

## 2018-06-29 NOTE — Patient Instructions (Addendum)
[facility] you [facility]!    Anne Eaton was seen by Joaquin CourtsKimberly Harris, FNP today.   Anne Eaton's primary care provider is Bing NeighborsHarris, Kimberly S, FNP.   For the best care possible, you should try to see Joaquin CourtsKimberly Harris, FNP-C whenever you come to the clinic.   We look forward to seeing you again soon!  If you have any questions about your visit today, please call us at [phone] or feel free to reach your primary care provider via MyChart.       Shortness of Breath, Adult Shortness of breath means you have trouble breathing. Shortness of breath could be a sign of a medical problem. Follow these instructions at home:   Watch for any changes in your symptoms.  Do not use any products that contain nicotine or tobacco, such as cigarettes, e-cigarettes, and chewing tobacco.  Do not smoke. Smoking can cause shortness of breath. If you need help to quit smoking, ask your doctor.  Avoid things that can make it harder to breathe, such as: ? Mold. ? Dust. ? Air pollution. ? Chemical smells. ? Things that can cause allergy symptoms (allergens), if you have allergies.  Keep your living space clean. Use products that help remove mold and dust.  Rest as needed. Slowly return to your normal activities.  Take over-the-counter and prescription medicines only as told by your doctor. This includes oxygen therapy and inhaled medicines.  Keep all follow-up visits as told by your doctor. This is important. Contact a doctor if:  Your condition does not get better as soon as expected.  You have a hard time doing your normal activities, even after you rest.  You have new symptoms. Get help right away if:  Your shortness of breath gets worse.  You have trouble breathing when you are resting.  You feel light-headed or you pass out (faint).  You have a cough that is not helped by medicines.  You cough up blood.  You have pain  with breathing.  You have pain in your chest, arms, shoulders, or belly (abdomen).  You have a fever.  You cannot walk up stairs.  You cannot exercise the way you normally do. These symptoms may represent a serious problem that is an emergency. Do not wait to see if the symptoms will go away. Get medical help right away. Call your local emergency services (911 in the [place] Do not drive yourself to the hospital. Summary  Shortness of breath is when you have trouble breathing enough air. It can be a sign of a medical problem.  Avoid things that make it hard for you to breathe, such as smoking, pollution, mold, and dust.  Watch for any changes in your symptoms. Contact your doctor if you do not get better or you get worse. This information is not intended to replace advice given to you by your health care provider. Make sure you discuss any questions you have with your health care provider. Document Released: 10/15/2007 Document Revised: 09/28/2017 Document Reviewed: 09/28/2017 Elsevier Interactive Patient Education  2019 Elsevier Inc.   Smoking Tobacco Information, Adult Smoking tobacco can be harmful to your health. Tobacco contains a poisonous (toxic), colorless chemical called nicotine. Nicotine is addictive. It changes the brain and can make it hard to stop smoking. Tobacco also has other toxic chemicals that can hurt your body and raise your risk of many cancers. How can smoking tobacco affect me? Smoking  tobacco puts you at risk for:  Cancer. Smoking is most commonly associated with lung cancer, but can also lead to cancer in other parts of the body.  Chronic obstructive pulmonary disease (COPD). This is a long-term lung condition that makes it hard to breathe. It also gets worse over time.  High blood pressure (hypertension), heart disease, stroke, or heart attack.  Lung infections, such as pneumonia.  Cataracts. This is when the lenses in the eyes become  clouded.  Digestive problems. This may include peptic ulcers, heartburn, and gastroesophageal reflux disease (GERD).  Oral health problems, such as gum disease and tooth loss.  Loss of taste and smell. Smoking can affect your appearance by causing:  Wrinkles.  Yellow or stained teeth, fingers, and fingernails. Smoking tobacco can also affect your social life, because:  It may be challenging to find places to smoke when away from home. Many workplaces, [employer], hotels, and public places are tobacco-free.  Smoking is expensive. This is due to the cost of tobacco and the long-term costs of treating health problems from smoking.  Secondhand smoke may affect those around you. Secondhand smoke can cause lung cancer, breathing problems, and heart disease. Children of smokers have a higher risk for: ? Sudden infant death syndrome (SIDS). ? Ear infections. ? Lung infections. If you currently smoke tobacco, quitting now can help you:  Lead a longer and healthier life.  Look, smell, breathe, and feel better over time.  Save money.  Protect others from the harms of secondhand smoke. What actions can I take to prevent health problems? Quit smoking   Do not start smoking. Quit if you already do.  Make a plan to quit smoking and commit to it. Look for programs to help you and ask your health care provider for recommendations and ideas.  Set a date and write down all the reasons you want to quit.  Let your friends and family know you are quitting so they can help and support you. Consider finding friends who also want to quit. It can be easier to quit with someone else, so that you can support each other.  Talk with your health care provider about using nicotine replacement medicines to help you quit, such as gum, lozenges, patches, sprays, or pills.  Do not replace cigarette smoking with electronic cigarettes, which are commonly called e-cigarettes. The safety of e-cigarettes is not  known, and some may contain harmful chemicals.  If you try to quit but return to smoking, stay positive. It is common to slip up when you first quit, so take it one day at a time.  Be prepared for cravings. When you feel the urge to smoke, chew gum or suck on hard candy. Lifestyle  Stay busy and take care of your body.  Drink enough fluid to keep your urine pale yellow.  Get plenty of exercise and eat a healthy diet. This can help prevent weight gain after quitting.  Monitor your eating habits. Quitting smoking can cause you to have a larger appetite than when you smoke.  Find ways to relax. Go out with friends or family to a movie or a restaurant where people do not smoke.  Ask your health care provider about having regular tests (screenings) to check for cancer. This may include blood tests, imaging tests, and other tests.  Find ways to manage your stress, such as meditation, yoga, or exercise. Where to find support To get support to quit smoking, consider:  Asking your health care provider  for more information and resources.  Taking classes to learn more about quitting smoking.  Looking for local organizations that offer resources about quitting smoking.  Joining a support group for people who want to quit smoking in your local community.  Calling the smokefree.gov counselor helpline: 1-800-Quit-Now 684-426-9765) Where to find more information You may find more information about quitting smoking from:  HelpGuide.org: www.helpguide.org  [employer]: smokefree.gov  American Lung Association: www.lung.org Contact a health care provider if you:  Have problems breathing.  Notice that your lips, nose, or fingers turn blue.  Have chest pain.  Are coughing up blood.  Feel faint or you pass out.  Have other health changes that cause you to worry. Summary  Smoking tobacco can negatively affect your health, the health of those around you, your finances, and your  social life.  Do not start smoking. Quit if you already do. If you need help quitting, ask your health care provider.  Think about joining a support group for people who want to quit smoking in your local community. There are many effective programs that will help you to quit this behavior. This information is not intended to replace advice given to you by your health care provider. Make sure you discuss any questions you have with your health care provider. Document Released: 05/13/2016 Document Revised: 06/17/2017 Document Reviewed: 05/13/2016 Elsevier Interactive Patient Education  2019 [employer].

## 2018-06-30 ENCOUNTER — Telehealth: Payer: Self-pay | Admitting: Family Medicine

## 2018-06-30 MED ORDER — OMEPRAZOLE 20 MG PO CPDR: 20.0000 mg | DELAYED_RELEASE_CAPSULE | Freq: Every day | ORAL | 2 refills | Status: DC

## 2018-06-30 NOTE — Telephone Encounter (Signed)
Patient called stating that she would like the prilosec 20mg  instead of the 40mg . Patient states that between her and her husband they both got confused as to which mg they were each taking (husband takes prilosec 40mg ). Patient stated that she was previously taking 20mg  and that it worked good for her. Please follow up.

## 2018-06-30 NOTE — Telephone Encounter (Signed)
Informed patient

## 2018-06-30 NOTE — Telephone Encounter (Signed)
Corrected dose of omeprazole has been sent to pharmacy on file

## 2018-07-02 DIAGNOSIS — R200 Anesthesia of skin: Secondary | ICD-10-CM | POA: Insufficient documentation

## 2018-07-02 DIAGNOSIS — R55 Syncope and collapse: Secondary | ICD-10-CM | POA: Insufficient documentation

## 2018-07-02 DIAGNOSIS — J45909 Unspecified asthma, uncomplicated: Secondary | ICD-10-CM | POA: Insufficient documentation

## 2018-07-02 DIAGNOSIS — K219 Gastro-esophageal reflux disease without esophagitis: Secondary | ICD-10-CM | POA: Insufficient documentation

## 2018-07-05 ENCOUNTER — Ambulatory Visit: Payer: Medicaid Other | Admitting: Family Medicine

## 2018-07-05 DIAGNOSIS — Z131 Encounter for screening for diabetes mellitus: Secondary | ICD-10-CM

## 2018-07-05 DIAGNOSIS — Z13220 Encounter for screening for lipoid disorders: Secondary | ICD-10-CM

## 2018-07-05 NOTE — Progress Notes (Signed)
Lab only visit 

## 2018-07-06 LAB — HEMOGLOBIN A1C
Est. average glucose Bld gHb Est-mCnc: 103 mg/dL
Hgb A1c MFr Bld: 5.2 % (ref 4.8–5.6)

## 2018-07-08 ENCOUNTER — Ambulatory Visit (INDEPENDENT_AMBULATORY_CARE_PROVIDER_SITE_OTHER): Payer: Medicaid Other

## 2018-07-08 ENCOUNTER — Ambulatory Visit (HOSPITAL_COMMUNITY): Payer: Medicaid Other | Attending: Internal Medicine

## 2018-07-08 ENCOUNTER — Other Ambulatory Visit: Payer: Self-pay | Admitting: Cardiovascular Disease

## 2018-07-08 DIAGNOSIS — R0600 Dyspnea, unspecified: Secondary | ICD-10-CM

## 2018-07-08 DIAGNOSIS — R55 Syncope and collapse: Secondary | ICD-10-CM

## 2018-07-08 LAB — LIPID PANEL W/O CHOL/HDL RATIO
Cholesterol, Total: 180 mg/dL (ref 100–199)
HDL: 47 mg/dL (ref >39)
LDL Calculated: 114 mg/dL — ABNORMAL HIGH (ref 0–99)
Triglycerides: 93 mg/dL (ref 0–149)
VLDL CHOLESTEROL CAL: 19 mg/dL (ref 5–40)

## 2018-07-08 LAB — SPECIMEN STATUS REPORT

## 2018-07-29 ENCOUNTER — Ambulatory Visit: Payer: Medicaid Other | Admitting: Neurology

## 2018-07-29 ENCOUNTER — Telehealth: Payer: Self-pay | Admitting: Neurology

## 2018-07-29 ENCOUNTER — Encounter: Payer: Self-pay | Admitting: Neurology

## 2018-07-29 ENCOUNTER — Other Ambulatory Visit: Payer: Self-pay

## 2018-07-29 DIAGNOSIS — R404 Transient alteration of awareness: Secondary | ICD-10-CM

## 2018-07-29 NOTE — Progress Notes (Signed)
PATIENT: Anne Eaton DOB: Oct 28, 1985  Chief Complaint  Patient presents with  . Possible Seizures    Reports three passing out events.  These loss of consciousness episodes came without warning.  One event, she felt she was out for nearly two hours.  She always feels foggy minded afterwards.  . Left Upper Quadrant Pain    She is currently wearing a 30-day heart monitor.  Says the pain feels like a "hot iron" poking her left breast.  . PCP    Scot Jun, FNP (recent ED visit)     HISTORICAL  Anne Eaton is a 33 year old female, seen in request by her primary care physician Dr. Molli Barrows for evaluation of episode of loss of consciousness, initial evaluation was on July 29, 2018.  I have reviewed and summarized the emergency room visit on May 26, 2018, when she was evaluated for loss of consciousness episode on Wednesday, first episode was on Friday May 21, 2018, she drove the vehicle to her her husband workplace around 1:30 PM, sitting in the passenger side playing on her phone, no warning signs, she woke up 1 hour later at 230, she denies tongue biting, no body achy pain,  Second episode was May 24, 2018, she was sitting on her couch watching TV program, woke up 1 hour later, there was no warning signs, she denies sleepiness, no body achy pain  Third episode was May 25, 2018, she was getting up from the couch, then woke up on the floor landed on her left side, 2 hours has passed, she denies self injury, no bowel and bladder incontinence, no tongue biting, complains right parietal area tingling sensation  She was evaluated by emergency room next day January 15  CT angiogram for chest PE protocol was negative,  She also complains of 10 years history of left chest area pain, as if a poker will stick into my chest, was seen by different specialist, CT of abdomen in December 2019 showed mild bibasilar atelectasis, left greater than right.  Laboratory  evaluation seen February 2020 LDL was 114, A1c was 5.2 troponin was negative, UDS was negative, normal TSH, ESR, CBC, with hemoglobin of 15.9, CMP,   She has been seen by cardiology, having 30 days cardiac monitoring REVIEW OF SYSTEMS: Full 14 system review of systems performed and notable only for weight gain, loss, fatigue, swelling legs, blurred vision, shortness of breath, cough, easy bruising, easy bleeding, feeling hot, cold, joint pain, achy muscles, allergy, runny nose, confusion, headaches, numbness, weakness, dizziness, passing out, insomnia, sleepiness, restless leg, anxiety, not enough sleep, decreased energy, racing thoughts. All other review of systems were negative.  ALLERGIES: Allergies  Allergen Reactions  . Augmentin [Amoxicillin-Pot Clavulanate] Other (See Comments)    Yeast infection  . Morphine And Related Other (See Comments)    Made me crazy    HOME MEDICATIONS: Current Outpatient Medications  Medication Sig Dispense Refill  . albuterol (PROVENTIL HFA;VENTOLIN HFA) 108 (90 Base) MCG/ACT inhaler Inhale 2 puffs into the lungs every 4 (four) hours as needed for wheezing or shortness of breath (cough, shortness of breath or wheezing.). 1 Inhaler 1  . montelukast (SINGULAIR) 10 MG tablet Take 1 tablet (10 mg total) by mouth at bedtime. 30 tablet 3  . omeprazole (PRILOSEC) 20 MG capsule Take 1 capsule (20 mg total) by mouth daily. 90 capsule 2   No current facility-administered medications for this visit.     PAST MEDICAL HISTORY: Past Medical History:  Diagnosis Date  . Asthma   . Hypercholesteremia   . Hypoglycemia   . Loss of consciousness ([place])     PAST SURGICAL HISTORY: Past Surgical History:  Procedure Laterality Date  . ABDOMINAL HYSTERECTOMY    . CESAREAN SECTION     x 3  . GALLBLADDER SURGERY    . TONSILLECTOMY    . WISDOM TOOTH EXTRACTION      FAMILY HISTORY: Family History  Problem Relation Age of Onset  . Basal cell carcinoma Mother   .  Hypertension Father   . Atrial fibrillation Father   . Hyperlipidemia Father   . Colon cancer Maternal Grandmother   . Breast cancer Maternal Grandmother   . Varicose Veins Maternal Grandmother   . Arthritis Maternal Grandfather   . Diabetes Maternal Grandfather   . Heart disease Maternal Grandfather   . Heart failure Maternal Grandfather   . Heart attack Maternal Grandfather   . Hypertension Maternal Grandfather   . Hyperlipidemia Maternal Grandfather   . Arthritis Paternal Grandmother   . COPD Paternal Grandmother   . Hypertension Paternal Grandmother   . Aortic stenosis Paternal Grandmother   . Stroke Paternal Grandmother   . Cataracts Paternal Grandmother   . Macular degeneration Paternal Grandmother   . Varicose Veins Paternal Grandmother   . Diabetes Paternal Grandfather   . Hypertension Paternal Grandfather   . Heart attack Paternal Grandfather   . Asthma Brother   . ADD / ADHD Son   . Asthma Son   . ADD / ADHD Son   . Asthma Son   . ADD / ADHD Son   . Asthma Son   . Alcohol abuse Maternal Uncle   . Arthritis Maternal Uncle   . Diabetes Maternal Uncle   . Hypertension Maternal Uncle   . Hyperlipidemia Maternal Uncle   . COPD Maternal Aunt   . Sleep apnea Maternal Aunt   . Diabetes Maternal Aunt   . Heart disease Maternal Aunt   . Lung cancer Maternal Uncle   . Prostate cancer Paternal Uncle   . Breast cancer Paternal Aunt   . Obesity Paternal Aunt   . Diabetes Paternal Uncle   . Heart attack Paternal Uncle   . Diabetes Paternal Uncle   . Heart attack Paternal Uncle   . Macular degeneration Paternal Uncle   . Diabetes Paternal Uncle   . Hearing loss Paternal Uncle   . Diabetes Paternal Uncle   . Diabetes Paternal Uncle   . Obesity Paternal Uncle   . Diabetes Paternal Aunt   . Hypertension Maternal Aunt   . Hypertension Maternal Aunt     SOCIAL HISTORY: Social History   Socioeconomic History  . Marital status: Married    Spouse name: Not on file   . Number of children: 3  . Years of education: some college  . Highest education level: Not on file  Occupational History  . Occupation: Unemployed  Social Needs  . Financial resource strain: Not on file  . Food insecurity:    Worry: Not on file    Inability: Not on file  . Transportation needs:    Medical: Not on file    Non-medical: Not on file  Tobacco Use  . Smoking status: Current Every Day Smoker  . Smokeless tobacco: Former Systems developer  . Tobacco comment: 0.5 - 1 ppd  Substance and Sexual Activity  . Alcohol use: Not Currently  . Drug use: Never  . Sexual activity: Yes  Lifestyle  . Physical activity:  Days per week: Not on file    Minutes per session: Not on file  . Stress: Not on file  Relationships  . Social connections:    Talks on phone: Not on file    Gets together: Not on file    Attends religious service: Not on file    Active member of club or organization: Not on file    Attends meetings of clubs or organizations: Not on file    Relationship status: Not on file  . Intimate partner violence:    Fear of current or ex partner: Not on file    Emotionally abused: Not on file    Physically abused: Not on file    Forced sexual activity: Not on file  Other Topics Concern  . Not on file  Social History Narrative   Lives at home with husband and stepdaughter.   Right-handed.   8-16 ounces caffeine per day.     PHYSICAL EXAM   Vitals:   07/29/18 0937  BP: 118/82  Pulse: 74  Weight: 201 lb (91.2 kg)  Height: '5\' 5"'$  (1.651 m)    Not recorded      Body mass index is 33.45 kg/m.  PHYSICAL EXAMNIATION:  Gen: NAD, conversant, well nourised, obese, well groomed                     Cardiovascular: Regular rate rhythm, no peripheral edema, warm, nontender. Eyes: Conjunctivae clear without exudates or hemorrhage Neck: Supple, no carotid bruits. Pulmonary: Clear to auscultation bilaterally   NEUROLOGICAL EXAM:  MENTAL STATUS: Speech:    Speech is  normal; fluent and spontaneous with normal comprehension.  Cognition:     Orientation to time, place and person     Normal recent and remote memory     Normal Attention span and concentration     Normal Language, naming, repeating,spontaneous speech     Fund of knowledge   CRANIAL NERVES: CN II: Visual fields are full to confrontation.Pupils are round equal and briskly reactive to light. CN III, IV, VI: extraocular movement are normal. No ptosis. CN V: Facial sensation is intact to pinprick in all 3 divisions bilaterally. Corneal responses are intact.  CN VII: Face is symmetric with normal eye closure and smile. CN VIII: Hearing is normal to rubbing fingers CN IX, X: Palate elevates symmetrically. Phonation is normal. CN XI: Head turning and shoulder shrug are intact CN XII: Tongue is midline with normal movements and no atrophy.  MOTOR: There is no pronator drift of out-stretched arms. Muscle bulk and tone are normal. Muscle strength is normal.  REFLEXES: Reflexes are 2+ and symmetric at the biceps, triceps, knees, and ankles. Plantar responses are flexor.  SENSORY: Intact to light touch, pinprick, positional sensation and vibratory sensation are intact in fingers and toes.  COORDINATION: Rapid alternating movements and fine finger movements are intact. There is no dysmetria on finger-to-nose and heel-knee-shin.    GAIT/STANCE: Posture is normal. Gait is steady with normal steps, base, arm swing, and turning. Heel and toe walking are normal. Tandem gait is normal.  Romberg is absent.   DIAGNOSTIC DATA (LABS, IMAGING, TESTING) - I reviewed patient records, labs, notes, testing and imaging myself where available.   ASSESSMENT AND PLAN  Vinita Prentiss Deroos is a 33 y.o. female   Unexplained episode of alteration of consciousness  Remote possibility of seizure, based on history,  Proceed with MRI of the brain with without contrast, EEG     Zhavia Cunanan  Krista Blue, M.D. Ph.D.  [facility] [address] [place], [place] [address] Ph: [phone] Fax: [phone]  CC: Referring Provider

## 2018-07-29 NOTE — Telephone Encounter (Signed)
Medicaid order sent to [facility]. They obtain the auth and reach out to the pt to schedule.  °

## 2018-08-04 ENCOUNTER — Telehealth: Payer: Self-pay | Admitting: Neurology

## 2018-08-04 NOTE — Telephone Encounter (Signed)
I have called patient, explained to her that our office will post phone her eeg.

## 2018-08-05 ENCOUNTER — Other Ambulatory Visit: Payer: Medicaid Other

## 2018-08-31 ENCOUNTER — Encounter: Payer: Self-pay | Admitting: Family Medicine

## 2018-08-31 NOTE — Telephone Encounter (Signed)
Medicaid Berkley Harvey: [id number] (exp. 08/26/18 to 02/22/19) pt is scheduled at [facility] for 09/01/18

## 2018-09-01 ENCOUNTER — Ambulatory Visit
Admission: RE | Admit: 2018-09-01 | Discharge: 2018-09-01 | Disposition: A | Discharge status: Home / Self Care | Payer: Medicaid Other | Source: Ambulatory Visit | Attending: Neurology | Admitting: Neurology

## 2018-09-01 ENCOUNTER — Other Ambulatory Visit: Payer: Self-pay

## 2018-09-01 DIAGNOSIS — R404 Transient alteration of awareness: Secondary | ICD-10-CM | POA: Diagnosis not present

## 2018-09-01 MED ORDER — GADOBENATE DIMEGLUMINE 529 MG/ML IV SOLN
18.0000 mL | Freq: Once | INTRAVENOUS | Status: AC | PRN
  Administered 2018-09-01: 18 mL via INTRAVENOUS

## 2018-09-02 ENCOUNTER — Encounter: Payer: Self-pay | Admitting: Family Medicine

## 2018-09-06 ENCOUNTER — Other Ambulatory Visit: Payer: Self-pay | Admitting: Family Medicine

## 2018-09-06 ENCOUNTER — Ambulatory Visit (INDEPENDENT_AMBULATORY_CARE_PROVIDER_SITE_OTHER): Payer: Medicaid Other | Admitting: Family Medicine

## 2018-09-06 ENCOUNTER — Encounter: Payer: Self-pay | Admitting: Family Medicine

## 2018-09-06 ENCOUNTER — Other Ambulatory Visit: Payer: Self-pay

## 2018-09-06 ENCOUNTER — Telehealth: Payer: Self-pay | Admitting: Family Medicine

## 2018-09-06 DIAGNOSIS — M799 Soft tissue disorder, unspecified: Secondary | ICD-10-CM | POA: Diagnosis not present

## 2018-09-06 DIAGNOSIS — F5101 Primary insomnia: Secondary | ICD-10-CM

## 2018-09-06 MED ORDER — TRAZODONE HCL 50 MG PO TABS: 50.0000 mg | ORAL_TABLET | Freq: Every evening | ORAL | 3 refills | Status: DC | PRN

## 2018-09-06 MED ORDER — CYCLOBENZAPRINE HCL 5 MG PO TABS: 5.0000 mg | ORAL_TABLET | Freq: Three times a day (TID) | ORAL | 1 refills | Status: DC | PRN

## 2018-09-06 MED ORDER — MELOXICAM 15 MG PO TABS: 15.0000 mg | ORAL_TABLET | Freq: Every day | ORAL | 0 refills | Status: DC

## 2018-09-06 NOTE — Telephone Encounter (Signed)
Please contact patient to schedule appointment. See prior Mychart messages with a request for an appointment.

## 2018-09-06 NOTE — Progress Notes (Signed)
Pain in back, shoulders, and neck.  Taking OTC Tylenol and Ibuprofen but no relief Has been doing a lot work outside.   No black out spells since January  Would like to get some sleep.  Has tried several interventions- sleep sounds, melatonin, activity during the day.

## 2018-09-06 NOTE — Progress Notes (Signed)
Virtual Visit via Telephone Note  I connected with Anne Eaton on 09/06/18 at  2:50 PM EDT by telephone and verified that I am speaking with the correct person using two identifiers.   I discussed the limitations, risks, security and privacy concerns of performing an evaluation and management service by telephone and the availability of in person appointments. I also discussed with the patient that there may be a patient responsible charge related to this service. The patient expressed understanding and agreed to proceed.   History of Present Illness: Insomnia  Anne Eaton is a current patient of record who complains today of difficulty falling and staying asleep. Reports difficulty sleeping has been an ongoing problem for years. Previously prescribed Xanax and Trazodone. Previous provider in [place] discontinued prescribing both medications to her. She is uncertain why medications were discontinued. She eports no history of anxiety or panic attacks. Drinks tea during the day and which is her only source of caffeine. On average routinely has been able to sleep 7-8 per night. Currently averaging 4 hours of sleep. Watches TV immediately before going to bed but not while in the bed. Denies prolonged screen time prior to bedtime. [name] has tried melatonin and OTC sleep aids which only mild induce sleep. She also tried listening to natural earth sound which was not successful in helping with sleep.  Musculoskeletal Pain  Complains of upper back, neck, and right arm pain. Suffers from chronic generalized muscular pain. Recently doing lots of work outside and pain has not improved with massage and OTC analgesics. No known injury. Has full ROM.  Assessment and Plan: 1. Primary insomnia -Trial Trazodone 50 mg initially one hour prior to bedtime. Advised to titrate dose to 100 mg if desired sleep quality is not achieved.  2. Musculoskeletal disorder Trial Meloxicam 15 mg once daily and Cyclobenzaprine 5  mg TID as needed  Follow Up Instructions: RTC 6 weeks evaluation of insomnia    I discussed the assessment and treatment plan with the patient. The patient was provided an opportunity to ask questions and all were answered. The patient agreed with the plan and demonstrated an understanding of the instructions.   The patient was advised to call back or seek an in-person evaluation if the symptoms worsen or if the condition fails to improve as anticipated.  I provided 18 minutes of non-face-to-face time during this encounter.   Joaquin Courts, FNP-C

## 2018-10-19 ENCOUNTER — Other Ambulatory Visit: Payer: Self-pay | Admitting: Family Medicine

## 2018-10-19 ENCOUNTER — Encounter: Payer: Self-pay | Admitting: Family Medicine

## 2018-10-19 ENCOUNTER — Other Ambulatory Visit: Payer: Self-pay

## 2018-10-19 ENCOUNTER — Ambulatory Visit (INDEPENDENT_AMBULATORY_CARE_PROVIDER_SITE_OTHER): Payer: Medicaid Other | Admitting: Family Medicine

## 2018-10-19 DIAGNOSIS — F5101 Primary insomnia: Secondary | ICD-10-CM

## 2018-10-19 MED ORDER — DOXEPIN HCL 25 MG PO CAPS: 25.0000 mg | ORAL_CAPSULE | Freq: Every evening | ORAL | 1 refills | Status: DC | PRN

## 2018-10-19 NOTE — Telephone Encounter (Signed)
Please advise 

## 2018-10-19 NOTE — Progress Notes (Signed)
Virtual Visit via Telephone Note  I connected with Anne Eaton on 10/19/18 at 10:10 AM EDT by telephone and verified that I am speaking with the correct person using two identifiers.  Location: Patient: Located at home during today's encounter  Provider: Located at primary care office    I discussed the limitations, risks, security and privacy concerns of performing an evaluation and management service by telephone and the availability of in person appointments. I also discussed with the patient that there may be a patient responsible charge related to this service. The patient expressed understanding and agreed to proceed.   History of Present Illness: Insomnia Patient with was started 6 weeks ago on trazodone 50 to 100 mg.  Patient reports Trazadone 50 mg was ineffective in achieving a full nights of sleep. She reports that the 100 mg tablets induced headache and cause excessive sleep. She is requesting a different medication for insomnia symptoms.  She has taken Xanax in the past and reports medication was effective. She continues to endorse efforts to reduce activities that interfere with sleep.  Assessment and Plan: Primary insomnia -discontinue trazodone. Will trial doxepin 25 mg at least 1 hour and 30 minutes prior to bedtime.   Follow Up Instructions: 6 week follow-up insomnia and cholesterol follow-up   I discussed the assessment and treatment plan with the patient. The patient was provided an opportunity to ask questions and all were answered. The patient agreed with the plan and demonstrated an understanding of the instructions.   The patient was advised to call back or seek an in-person evaluation if the symptoms worsen or if the condition fails to improve as anticipated.  I provided 15 minutes of non-face-to-face time during this encounter.   Molli Barrows, FNP-c

## 2018-10-19 NOTE — Progress Notes (Deleted)
Per pt she is having problems with sleeping. Per pt this has been a problem for the past 5 years.   Per pt she was prescribed Trazodone and it is not working all that great  Per pt she had Xanax but only had 5 of those and those worked great and then moved down from [place].   Tried Melatonin and it makes her hyper

## 2018-11-15 ENCOUNTER — Ambulatory Visit (HOSPITAL_COMMUNITY)
Admission: EM | Admit: 2018-11-15 | Discharge: 2018-11-15 | Disposition: A | Discharge status: Home / Self Care | Payer: Medicaid Other | Attending: Family Medicine | Admitting: Family Medicine

## 2018-11-15 ENCOUNTER — Encounter (HOSPITAL_COMMUNITY): Payer: Self-pay | Admitting: Emergency Medicine

## 2018-11-15 ENCOUNTER — Encounter: Payer: Self-pay | Admitting: Family Medicine

## 2018-11-15 DIAGNOSIS — N644 Mastodynia: Secondary | ICD-10-CM | POA: Diagnosis not present

## 2018-11-15 DIAGNOSIS — N62 Hypertrophy of breast: Secondary | ICD-10-CM | POA: Diagnosis present

## 2018-11-15 DIAGNOSIS — N6452 Nipple discharge: Secondary | ICD-10-CM | POA: Diagnosis present

## 2018-11-15 LAB — TSH: TSH: 1.066 u[IU]/mL (ref 0.350–4.500)

## 2018-11-15 NOTE — ED Triage Notes (Signed)
Pt c/o bilateral breast pain and swelling x7 years, states this has been on ongoing problem but has never had a mammogram.

## 2018-11-15 NOTE — Discharge Instructions (Signed)
With breast pain it is good to wear a supportive brassiere Warm or cold compresses can sometimes help You may take Tylenol or your meloxicam (Mobic) I am doing blood work to check hormonal levels I am sending a note to Lavell Anchors, FNP so she can check on your blood work and order your mammogram

## 2018-11-15 NOTE — ED Provider Notes (Signed)
[place]    CSN: [id number] Arrival date & time: 11/15/18  [place]      History   Chief Complaint Chief Complaint  Patient presents with  . Breast Pain    HPI Anne Eaton is a 33 y.o. female.   HPI Patient states she is here for bilateral breast pain and swelling for 7 days.  She states that it comes and goes.  [place]  She states she has gone from "a D cup to a double D cup in the period of 1 week.  Both breast hurt.  Both breasts feel full.  Both breasts are very tender to touch.  The left breast has some drainage from the nipple.  There is no discoloration.  No palpable lump Patient has an excess of concern because breast cancer runs in her family.  She states both her grandmother and her aunt have had breast cancer.  There is been no genetic testing performed. I explained to the patient that she was recently started on doxepin.  I told her that this medicine can cause gynecomastia and pain.  She immediately told me that she is only taking it about twice a week and she does not think that is related.  She is worried about cancer.  She also thinks that she might be going into an early menopause.  She had a hysterectomy at a young age but has one ovary.  She is not having hot flashes or any other symptoms.  She is requesting blood work and a mammogram.  I told her I was unable to provide her with a mammogram order after 5, but her primary care doctor might offer this to her tomorrow Past Medical History:  Diagnosis Date  . Asthma   . Hypercholesteremia   . Hypoglycemia   . Loss of consciousness [facility])     Patient Active Problem List   Diagnosis Date Noted  . Alteration consciousness 07/29/2018  . Extrinsic asthma without complication 03/47/4259  . Syncope 07/02/2018  . Left sided numbness and tingling 07/02/2018  . Gastroesophageal reflux disease without esophagitis 07/02/2018    Past Surgical History:  Procedure Laterality  Date  . ABDOMINAL HYSTERECTOMY    . CESAREAN SECTION     x 3  . GALLBLADDER SURGERY    . TONSILLECTOMY    . WISDOM TOOTH EXTRACTION      OB History   No obstetric history on file.      Home Medications    Prior to Admission medications   Medication Sig Start Date End Date Taking? Authorizing Provider  cyclobenzaprine (FLEXERIL) 5 MG tablet Take 1 tablet (5 mg total) by mouth 3 (three) times daily as needed for muscle spasms. 09/06/18   Scot Jun, FNP  doxepin (SINEQUAN) 25 MG capsule Take 1-2 capsules (25-50 mg total) by mouth at bedtime as needed. 10/19/18   Scot Jun, FNP  fexofenadine-pseudoephedrine (ALLEGRA-D 24) 180-240 MG 24 hr tablet Take 1 tablet by mouth daily.    [provider]  meloxicam (MOBIC) 15 MG tablet TAKE 1 TABLET BY MOUTH EVERY DAY 10/19/18   Scot Jun, FNP  omeprazole (PRILOSEC) 20 MG capsule Take 1 capsule (20 mg total) by mouth daily. 06/30/18   Scot Jun, FNP  PROAIR HFA 108 [id number] Base) MCG/ACT inhaler INHALE 2 PUFFS INTO THE LUNGS EVERY 4 (FOUR) HOURS AS NEEDED FOR WHEEZING OR SHORTNESS OF BREATH 09/07/18   Scot Jun, FNP  Family History Family History  Problem Relation Age of Onset  . Basal cell carcinoma Mother   . Hypertension Father   . Atrial fibrillation Father   . Hyperlipidemia Father   . Colon cancer Maternal Grandmother   . Breast cancer Maternal Grandmother   . Varicose Veins Maternal Grandmother   . Arthritis Maternal Grandfather   . Diabetes Maternal Grandfather   . Heart disease Maternal Grandfather   . Heart failure Maternal Grandfather   . Heart attack Maternal Grandfather   . Hypertension Maternal Grandfather   . Hyperlipidemia Maternal Grandfather   . Arthritis Paternal Grandmother   . COPD Paternal Grandmother   . Hypertension Paternal Grandmother   . Aortic stenosis Paternal Grandmother   . Stroke Paternal Grandmother   . Cataracts Paternal Grandmother   . Macular  degeneration Paternal Grandmother   . Varicose Veins Paternal Grandmother   . Diabetes Paternal Grandfather   . Hypertension Paternal Grandfather   . Heart attack Paternal Grandfather   . Asthma Brother   . ADD / ADHD Son   . Asthma Son   . ADD / ADHD Son   . Asthma Son   . ADD / ADHD Son   . Asthma Son   . Alcohol abuse Maternal Uncle   . Arthritis Maternal Uncle   . Diabetes Maternal Uncle   . Hypertension Maternal Uncle   . Hyperlipidemia Maternal Uncle   . COPD Maternal Aunt   . Sleep apnea Maternal Aunt   . Diabetes Maternal Aunt   . Heart disease Maternal Aunt   . Lung cancer Maternal Uncle   . Prostate cancer Paternal Uncle   . Breast cancer Paternal Aunt   . Obesity Paternal Aunt   . Diabetes Paternal Uncle   . Heart attack Paternal Uncle   . Diabetes Paternal Uncle   . Heart attack Paternal Uncle   . Macular degeneration Paternal Uncle   . Diabetes Paternal Uncle   . Hearing loss Paternal Uncle   . Diabetes Paternal Uncle   . Diabetes Paternal Uncle   . Obesity Paternal Uncle   . Diabetes Paternal Aunt   . Hypertension Maternal Aunt   . Hypertension Maternal Aunt     Social History Social History   Tobacco Use  . Smoking status: Current Some Day Smoker    Packs/day: 0.50    Types: Cigarettes  . Smokeless tobacco: Former NeurosurgeonUser  . Tobacco comment: 0.5 - 1 ppd  Substance Use Topics  . Alcohol use: Not Currently  . Drug use: Never     Allergies   Augmentin [amoxicillin-pot clavulanate], Morphine and related, and Trazodone and nefazodone   Review of Systems Review of Systems  Constitutional: Negative for chills and fever.  HENT: Negative for ear pain and sore throat.   Eyes: Negative for pain and visual disturbance.  Respiratory: Negative for cough and shortness of breath.   Cardiovascular: Negative for chest pain and palpitations.  Gastrointestinal: Negative for abdominal pain and vomiting.  Genitourinary: Negative for dysuria and hematuria.        Breast pain  Musculoskeletal: Negative for arthralgias and back pain.  Skin: Negative for color change and rash.  Neurological: Negative for seizures and syncope.  All other systems reviewed and are negative.    Physical Exam Triage Vital Signs ED Triage Vitals  Enc Vitals Group     BP 11/15/18 1854 138/82     Pulse Rate 11/15/18 1854 88     Resp 11/15/18 1854 16  Temp 11/15/18 1854 98.4 F (36.9 C)     Temp src --      SpO2 11/15/18 1854 98 %     Weight --      Height --      Head Circumference --      Peak Flow --      Pain Score 11/15/18 1855 5     Pain Loc --      Pain Edu? --      Excl. in GC? --    No data found.  Updated Vital Signs BP 138/82   Pulse 88   Temp 98.4 F (36.9 C)   Resp 16   SpO2 98%   Visual Acuity Right Eye Distance:   Left Eye Distance:   Bilateral Distance:    Right Eye Near:   Left Eye Near:    Bilateral Near:     Physical Exam Constitutional:      General: She is not in acute distress.    Appearance: She is well-developed.  HENT:     Head: Normocephalic and atraumatic.  Eyes:     Conjunctiva/sclera: Conjunctivae normal.     Pupils: Pupils are equal, round, and reactive to light.  Neck:     Musculoskeletal: Normal range of motion.  Cardiovascular:     Rate and Rhythm: Normal rate.  Pulmonary:     Effort: Pulmonary effort is normal. No respiratory distress.  Chest:     Chest wall: No mass or tenderness.     Breasts: Breasts are symmetrical.        Right: Tenderness present. No swelling, bleeding, inverted nipple, mass, nipple discharge or skin change.        Left: Tenderness present. No swelling, bleeding, inverted nipple, mass, nipple discharge or skin change.     Comments: Breasts appear symmetric.  No dimpling.  Axilla clear.  There are small palpable nodularities that may be adenomas.  These are not the specific tenderness.  Patient states her entire breast both sides is very painful. Abdominal:     General: There  is no distension.     Palpations: Abdomen is soft.  Musculoskeletal: Normal range of motion.  Skin:    General: Skin is warm and dry.  Neurological:     Mental Status: She is alert.      UC Treatments / Results  Labs (all labs ordered are listed, but only abnormal results are displayed) Labs Reviewed  TSH  FOLLICLE STIMULATING HORMONE  LUTEINIZING HORMONE  PROLACTIN    EKG   Radiology No results found.  Procedures Procedures (including critical care time)  Medications Ordered in UC Medications - No data to display  Initial Impression / Assessment and Plan / UC Course  I have reviewed the triage vital signs and the nursing notes.  Pertinent labs & imaging results that were available during my care of the patient were reviewed by me and considered in my medical decision making (see chart for details).      Final Clinical Impressions(s) / UC Diagnoses   Final diagnoses:  Breast pain  Gynecomastia  Nipple discharge     Discharge Instructions     With breast pain it is good to wear a supportive brassiere Warm or cold compresses can sometimes help You may take Tylenol or your meloxicam (Mobic) I am doing blood work to check hormonal levels I am sending a note to Jerrilyn CairoKim Harris, FNP so she can check on your blood work and order your mammogram  ED Prescriptions    None     Controlled Substance Prescriptions [place] Controlled Substance Registry consulted? Not Applicable   Eustace MooreNelson, Donivin Wirt Sue, MD 11/15/18 [phone]

## 2018-11-16 ENCOUNTER — Other Ambulatory Visit: Payer: Self-pay | Admitting: Family Medicine

## 2018-11-16 DIAGNOSIS — N63 Unspecified lump in unspecified breast: Secondary | ICD-10-CM

## 2018-11-16 DIAGNOSIS — N644 Mastodynia: Secondary | ICD-10-CM

## 2018-11-16 NOTE — Progress Notes (Signed)
Order placed per documentation from urgent care visit yesterday. Patient notified via my chart of order placed. She initially sent a Mychart message with breast symptoms complaints and followed up at [facility].

## 2018-11-17 LAB — PROLACTIN: Prolactin: 19.8 ng/mL (ref 4.8–23.3)

## 2018-11-17 LAB — LUTEINIZING HORMONE: LH: 3.7 m[IU]/mL

## 2018-11-17 LAB — FOLLICLE STIMULATING HORMONE: FSH: 6.9 m[IU]/mL

## 2018-11-19 ENCOUNTER — Telehealth (HOSPITAL_COMMUNITY): Payer: Self-pay | Admitting: Emergency Medicine

## 2018-11-19 NOTE — Telephone Encounter (Signed)
Normal labs. Attempted to contact patient, no answer.

## 2018-11-23 ENCOUNTER — Other Ambulatory Visit: Payer: Self-pay | Admitting: Family Medicine

## 2018-11-30 ENCOUNTER — Ambulatory Visit: Payer: Medicaid Other | Admitting: Critical Care Medicine

## 2018-11-30 ENCOUNTER — Other Ambulatory Visit: Payer: Self-pay

## 2018-11-30 ENCOUNTER — Ambulatory Visit
Admission: RE | Admit: 2018-11-30 | Discharge: 2018-11-30 | Disposition: A | Discharge status: Home / Self Care | Payer: Medicaid Other | Source: Ambulatory Visit | Attending: Family Medicine | Admitting: Family Medicine

## 2018-11-30 ENCOUNTER — Other Ambulatory Visit: Payer: Self-pay | Admitting: Family Medicine

## 2018-11-30 ENCOUNTER — Ambulatory Visit: Payer: Medicaid Other | Admitting: Family Medicine

## 2018-11-30 ENCOUNTER — Telehealth: Payer: Self-pay

## 2018-11-30 DIAGNOSIS — N63 Unspecified lump in unspecified breast: Secondary | ICD-10-CM

## 2018-11-30 DIAGNOSIS — N644 Mastodynia: Secondary | ICD-10-CM

## 2018-11-30 DIAGNOSIS — N6489 Other specified disorders of breast: Secondary | ICD-10-CM

## 2018-11-30 DIAGNOSIS — N6310 Unspecified lump in the right breast, unspecified quadrant: Secondary | ICD-10-CM

## 2018-11-30 NOTE — Telephone Encounter (Signed)
Called patient to do their pre-visit COVID screening.  Call went to voicemail. Unable to do prescreening.  

## 2018-12-01 ENCOUNTER — Telehealth: Payer: Self-pay

## 2018-12-01 ENCOUNTER — Encounter: Payer: Self-pay | Admitting: Nurse Practitioner

## 2018-12-01 ENCOUNTER — Ambulatory Visit (INDEPENDENT_AMBULATORY_CARE_PROVIDER_SITE_OTHER): Payer: Medicaid Other | Admitting: Nurse Practitioner

## 2018-12-01 VITALS — BP 113/76 | HR 57 | Temp 97.3°F | Resp 17 | Ht 65.0 in | Wt 198.0 lb

## 2018-12-01 DIAGNOSIS — N6452 Nipple discharge: Secondary | ICD-10-CM

## 2018-12-01 DIAGNOSIS — N644 Mastodynia: Secondary | ICD-10-CM

## 2018-12-01 DIAGNOSIS — F5101 Primary insomnia: Secondary | ICD-10-CM

## 2018-12-01 NOTE — Progress Notes (Signed)
Assessment & Plan:  Anne Eaton was seen today for insomnia.  Diagnoses and all orders for this visit:  Primary insomnia Continue Doxepin as prescribed.  Nipple discharge in female -     MR BREAST LEFT WO CONTRAST; Future    Patient has been counseled on age-appropriate routine health concerns for screening and prevention. These are reviewed and up-to-date. Referrals have been placed accordingly. Immunizations are up-to-date or declined.    Subjective:   Chief Complaint  Patient presents with  . Insomnia    says that insomnia has been better with the doxepin. is sleeping better & the medication doesn't give her a headache like the 100mg  of Trazodone did   HPI Anne Eaton 33 y.o. female presents to office today with for follow up to insomnia.   She has been prescribed trazodone in the past for her insomnia which was not effective therefore her PCP started her on doxepin 25 mg at night which today she reports has provided almost complete resolution of her insomnia.  She would like to continue on this medication for now.  She also has a history of breast pain, gynecomastia and left nipple discharge.  Prolactin level normal at 19.8.   Today she report nipple discharge continues intermittently.   PER ED REPORT: 11-15-2018 Intermittent Bilateral breast pain and swelling for 7 days.   She states she has gone from "a D cup to a double D cup in the period of 1 week.  Both breast hurt.  Both breasts feel full.  Both breasts are very tender to touch.  The left breast has some drainage from the nipple.  There is no discoloration.  No palpable lump Patient has an excess of concern because breast cancer runs in her family.  She states both her grandmother and her aunt have had breast cancer.  There is been no genetic testing performed. I explained to the patient that she was recently started on doxepin.  I told her that this medicine can cause gynecomastia and pain.  She immediately told me that  she is only taking it about twice a week and she does not think that is related.  She is worried about cancer.  She also thinks that she might be going into an early menopause.  She had a hysterectomy at a young age but has one ovary.  She is not having hot flashes or any other symptoms.  She is requesting blood work and a mammogram.  I told her I was unable to provide her with a mammogram order after 5, but her primary care doctor might offer this to her tomorrow MAMMOGRAM RESULTS Recommend an MRI for further assessment of the patient's nipple discharge.  Recommend six-month follow-up mammography and ultrasound of the probably benign right breast mass and six-month follow-up mammogram for the probably benign left breast mass.  Review of Systems  Constitutional: Negative for fever, malaise/fatigue and weight loss.  HENT: Negative.  Negative for nosebleeds.   Eyes: Negative.  Negative for blurred vision, double vision and photophobia.  Respiratory: Negative.  Negative for cough and shortness of breath.   Cardiovascular: Negative.  Negative for chest pain, palpitations and leg swelling.  Gastrointestinal: Negative.  Negative for heartburn, nausea and vomiting.  Musculoskeletal: Negative.  Negative for myalgias.  Skin:       SEE HPI  Neurological: Negative.  Negative for dizziness, focal weakness, seizures and headaches.  Psychiatric/Behavioral: Negative for suicidal ideas. The patient has insomnia.     Past Medical  History:  Diagnosis Date  . Asthma   . Hypercholesteremia   . Hypoglycemia   . Loss of consciousness [facility](HCC)     Past Surgical History:  Procedure Laterality Date  . ABDOMINAL HYSTERECTOMY    . CESAREAN SECTION     x 3  . GALLBLADDER SURGERY    . TONSILLECTOMY    . WISDOM TOOTH EXTRACTION      Family History  Problem Relation Age of Onset  . Basal cell carcinoma Mother   . Hypertension Father   . Atrial fibrillation Father   . Hyperlipidemia Father   . Colon cancer  Maternal Grandmother   . Breast cancer Maternal Grandmother   . Varicose Veins Maternal Grandmother   . Arthritis Maternal Grandfather   . Diabetes Maternal Grandfather   . Heart disease Maternal Grandfather   . Heart failure Maternal Grandfather   . Heart attack Maternal Grandfather   . Hypertension Maternal Grandfather   . Hyperlipidemia Maternal Grandfather   . Arthritis Paternal Grandmother   . COPD Paternal Grandmother   . Hypertension Paternal Grandmother   . Aortic stenosis Paternal Grandmother   . Stroke Paternal Grandmother   . Cataracts Paternal Grandmother   . Macular degeneration Paternal Grandmother   . Varicose Veins Paternal Grandmother   . Diabetes Paternal Grandfather   . Hypertension Paternal Grandfather   . Heart attack Paternal Grandfather   . Asthma Brother   . ADD / ADHD Son   . Asthma Son   . ADD / ADHD Son   . Asthma Son   . ADD / ADHD Son   . Asthma Son   . Alcohol abuse Maternal Uncle   . Arthritis Maternal Uncle   . Diabetes Maternal Uncle   . Hypertension Maternal Uncle   . Hyperlipidemia Maternal Uncle   . COPD Maternal Aunt   . Sleep apnea Maternal Aunt   . Diabetes Maternal Aunt   . Heart disease Maternal Aunt   . Lung cancer Maternal Uncle   . Prostate cancer Paternal Uncle   . Breast cancer Paternal Aunt   . Obesity Paternal Aunt   . Diabetes Paternal Uncle   . Heart attack Paternal Uncle   . Diabetes Paternal Uncle   . Heart attack Paternal Uncle   . Macular degeneration Paternal Uncle   . Diabetes Paternal Uncle   . Hearing loss Paternal Uncle   . Diabetes Paternal Uncle   . Diabetes Paternal Uncle   . Obesity Paternal Uncle   . Diabetes Paternal Aunt   . Hypertension Maternal Aunt   . Hypertension Maternal Aunt     Social History Reviewed with no changes to be made today.   Outpatient Medications Prior to Visit  Medication Sig Dispense Refill  . doxepin (SINEQUAN) 25 MG capsule Take 1-2 capsules (25-50 mg total) by mouth  at bedtime as needed. 40 capsule 1  . fexofenadine-pseudoephedrine (ALLEGRA-D 24) 180-240 MG 24 hr tablet Take 1 tablet by mouth daily.    . meloxicam (MOBIC) 15 MG tablet TAKE 1 TABLET BY MOUTH EVERY DAY 30 tablet 3  . omeprazole (PRILOSEC) 20 MG capsule Take 1 capsule (20 mg total) by mouth daily. 90 capsule 2  . PROAIR HFA 108 (90 Base) MCG/ACT inhaler INHALE 2 PUFFS INTO THE LUNGS EVERY 4 (FOUR) HOURS AS NEEDED FOR WHEEZING OR SHORTNESS OF BREATH 8.5 Inhaler 2  . cyclobenzaprine (FLEXERIL) 5 MG tablet Take 1 tablet (5 mg total) by mouth 3 (three) times daily as needed for muscle  spasms. 30 tablet 1   No facility-administered medications prior to visit.     Allergies  Allergen Reactions  . Augmentin [Amoxicillin-Pot Clavulanate] Other (See Comments)    Yeast infection  . Morphine And Related Other (See Comments)    Made me crazy  . Trazodone And Nefazodone     Headache        Objective:    BP 113/76   Pulse (!) 57   Temp (!) 97.3 F (36.3 C) (Temporal)   Resp 17   Ht 5\' 5"  (1.651 m)   Wt 198 lb (89.8 kg)   SpO2 96%   BMI 32.95 kg/m  Wt Readings from Last 3 Encounters:  12/01/18 198 lb (89.8 kg)  07/29/18 201 lb (91.2 kg)  06/29/18 199 lb 12.8 oz (90.6 kg)    Physical Exam Vitals signs and nursing note reviewed.  Constitutional:      Appearance: She is well-developed.  HENT:     Head: Normocephalic and atraumatic.  Neck:     Musculoskeletal: Normal range of motion.  Cardiovascular:     Rate and Rhythm: Normal rate and regular rhythm.     Heart sounds: Normal heart sounds. No murmur. No friction rub. No gallop.   Pulmonary:     Effort: Pulmonary effort is normal. No tachypnea or respiratory distress.     Breath sounds: Normal breath sounds. No decreased breath sounds, wheezing, rhonchi or rales.  Chest:     Breasts: Breasts are symmetrical.        Right: Tenderness present.        Left: Tenderness present.  Abdominal:     General: Bowel sounds are normal.      Palpations: Abdomen is soft.  Musculoskeletal: Normal range of motion.  Skin:    General: Skin is warm and dry.  Neurological:     Mental Status: She is alert and oriented to person, place, and time.     Coordination: Coordination normal.  Psychiatric:        Behavior: Behavior normal. Behavior is cooperative.        Thought Content: Thought content normal.        Judgment: Judgment normal.       Patient has been counseled extensively about nutrition and exercise as well as the importance of adherence with medications and regular follow-up. The patient was given clear instructions to go to ER or return to medical center if symptoms don't improve, worsen or new problems develop. The patient verbalized understanding.   Follow-up: Return in about 2 months (around 02/01/2019) for Fasting lipid panel.   Gildardo Pounds, FNP-BC [facility] [place], [place]   12/01/2018, 3:52 PM

## 2018-12-01 NOTE — Patient Instructions (Signed)

## 2018-12-01 NOTE — Telephone Encounter (Signed)
Called [employer]([phone]) to attempt to get prior auth for CPT [phone]. Spoke with intake specialist Alaina S. Case # [id number]. Went through the questions & they are requiring additional clinical information be faxed to [phone]. Will fax today's office note once complete & recent imaging.

## 2018-12-02 NOTE — Telephone Encounter (Signed)
Patient is scheduled for 12/07/2018 at 7 PM at [facility]. Patient has a 6:45 PM arrival time. Must wear mask & enter building alone due to COVID restrictions. Patient notified via MyChart message.

## 2018-12-02 NOTE — Telephone Encounter (Signed)
Received multiple "communication errors" from both fax machines. Will call [employer] back to see if there is an alternate fax number. Spoke with [place] who states that number is the only clinical information fax number. [place] transferred me to [place] who let me turn in the additional clinical information over the phone. Case was approved. Approval number [id number]. Valid 12/01/2018-05/30/2019.

## 2018-12-02 NOTE — Telephone Encounter (Signed)
Faxed last office note & recent imaging to [phone]. Awaiting confirmation.

## 2018-12-07 ENCOUNTER — Ambulatory Visit (HOSPITAL_COMMUNITY): Admission: RE | Admit: 2018-12-07 | Payer: Medicaid Other | Source: Ambulatory Visit

## 2018-12-07 NOTE — Telephone Encounter (Signed)
Checked [employer] website to follow up on case # [id number]. As of now a determination has not been made.

## 2018-12-07 NOTE — Telephone Encounter (Addendum)
Received message from Radiology that order had been changed from [id number] to [id number] due to protocols. Will have to contact [employer] to see if PA can be adjusted to reflect new CPT code.  Called [employer]([phone]) to see if PA could be adjusted to reflect new CPT code. Lonn [place] M transferred me to clinical nursing to see if they could just change the CPT code. Katharine Look states that she would have to start a whole new case.

## 2018-12-07 NOTE — Telephone Encounter (Signed)
New case number is [id number]. Anne Eaton states that a determination should be made by end of business today.

## 2018-12-10 ENCOUNTER — Encounter: Payer: Self-pay | Admitting: Family Medicine

## 2018-12-10 ENCOUNTER — Telehealth: Payer: Self-pay | Admitting: Family Medicine

## 2018-12-10 NOTE — Telephone Encounter (Signed)
Need p2p thanks.

## 2018-12-10 NOTE — Telephone Encounter (Signed)
Patient called wanting to speak to someone in regards to prior approval. Please follow up

## 2018-12-10 NOTE — Telephone Encounter (Signed)
Prior authorization for CPT 77049(bilateral breast MRI) was denied. The re [facility] could not be approved because guidelines support MRI for the evaluation on pathologic nipple discharge/galactorrhea when ductography is unavailable or technically limited.  Please advise. The unilateral MRI was approved but radiology says that they can't do just one side.

## 2018-12-13 ENCOUNTER — Ambulatory Visit (HOSPITAL_COMMUNITY): Payer: Self-pay

## 2018-12-13 ENCOUNTER — Encounter (HOSPITAL_COMMUNITY): Payer: Self-pay

## 2018-12-13 NOTE — Telephone Encounter (Signed)
Peer to peer requested

## 2018-12-13 NOTE — Telephone Encounter (Signed)
Thanks please let me know when it can be set up. Needs to be between the hours of 12:30 and 1:30 pm.

## 2018-12-13 NOTE — Telephone Encounter (Signed)
Followed up via MyChart.

## 2018-12-14 ENCOUNTER — Encounter: Payer: Self-pay | Admitting: Nurse Practitioner

## 2018-12-14 NOTE — Progress Notes (Signed)
FYI

## 2018-12-14 NOTE — Progress Notes (Signed)
[facility] APPROVAL NUMBER FOR Bilateral Breast MRI [id number] Per Dr. Mellody Drown

## 2018-12-24 ENCOUNTER — Ambulatory Visit (HOSPITAL_COMMUNITY)
Admission: RE | Admit: 2018-12-24 | Discharge: 2018-12-24 | Disposition: A | Discharge status: Home / Self Care | Payer: Medicaid Other | Source: Ambulatory Visit | Attending: Nurse Practitioner | Admitting: Nurse Practitioner

## 2018-12-24 ENCOUNTER — Other Ambulatory Visit: Payer: Self-pay

## 2018-12-24 DIAGNOSIS — N6452 Nipple discharge: Secondary | ICD-10-CM | POA: Insufficient documentation

## 2018-12-24 MED ORDER — GADOBUTROL 1 MMOL/ML IV SOLN
9.0000 mL | Freq: Once | INTRAVENOUS | Status: AC | PRN
  Administered 2018-12-24: 9 mL via INTRAVENOUS

## 2019-01-10 ENCOUNTER — Other Ambulatory Visit: Payer: Self-pay | Admitting: Family Medicine

## 2019-01-25 ENCOUNTER — Ambulatory Visit (HOSPITAL_COMMUNITY)
Admission: EM | Admit: 2019-01-25 | Discharge: 2019-01-25 | Disposition: A | Discharge status: Home / Self Care | Payer: Medicaid Other | Attending: Emergency Medicine | Admitting: Emergency Medicine

## 2019-01-25 ENCOUNTER — Encounter (HOSPITAL_COMMUNITY): Payer: Self-pay

## 2019-01-25 ENCOUNTER — Other Ambulatory Visit: Payer: Self-pay

## 2019-01-25 DIAGNOSIS — M5441 Lumbago with sciatica, right side: Secondary | ICD-10-CM

## 2019-01-25 MED ORDER — PREDNISONE 10 MG PO TABS: 40.0000 mg | ORAL_TABLET | Freq: Every day | ORAL | 0 refills | Status: AC

## 2019-01-25 NOTE — Discharge Instructions (Signed)
Take the prednisone daily for 5 days.    You can continue to take the Flexeril and meloxicam as needed for discomfort.    Return here or follow-up with an orthopedist if your pain continues or worsens; or if you develop weakness, numbness, loss of control of your bowel or bladder, or other concerning symptoms.

## 2019-01-25 NOTE — ED Provider Notes (Signed)
[facility]    CSN: [id number] Arrival date & time: 01/25/19  1904      History   Chief Complaint Chief Complaint  Patient presents with  . Back Pain    HPI Renae FickleCynthia L Stairs is a 33 y.o. female.   Patient presents with bilateral lower back pain, R>L x 4 days.  The pain is now radiating to her posterior mid thigh.  She denies fall or injury.  She has attempted treatment at home with Flexeril and meloxicam which she had leftover from a previous injury.  She denies weakness, saddle anesthesia, incontinence of bowel or bladder, numbness, or other symptoms.  LMP: hysterectomy.    The history is provided by the patient.    Past Medical History:  Diagnosis Date  . Asthma   . Hypercholesteremia   . Hypoglycemia   . Loss of consciousness [facility](HCC)     Patient Active Problem List   Diagnosis Date Noted  . Alteration consciousness 07/29/2018  . Extrinsic asthma without complication 07/02/2018  . Syncope 07/02/2018  . Left sided numbness and tingling 07/02/2018  . Gastroesophageal reflux disease without esophagitis 07/02/2018    Past Surgical History:  Procedure Laterality Date  . ABDOMINAL HYSTERECTOMY    . CESAREAN SECTION     x 3  . GALLBLADDER SURGERY    . TONSILLECTOMY    . WISDOM TOOTH EXTRACTION      OB History   No obstetric history on file.      Home Medications    Prior to Admission medications   Medication Sig Start Date End Date Taking? Authorizing Provider  cyclobenzaprine (FLEXERIL) 5 MG tablet TAKE 1 TABLET BY MOUTH THREE TIMES A DAY AS NEEDED FOR MUSCLE SPASMS 01/11/19   Grayce SessionsEdwards, Michelle P, NP  doxepin (SINEQUAN) 25 MG capsule Take 1-2 capsules (25-50 mg total) by mouth at bedtime as needed. 10/19/18   Bing NeighborsHarris, Kimberly S, FNP  fexofenadine-pseudoephedrine (ALLEGRA-D 24) 180-240 MG 24 hr tablet Take 1 tablet by mouth daily.    [provider]  meloxicam (MOBIC) 15 MG tablet TAKE 1 TABLET BY MOUTH EVERY DAY 10/19/18   Bing NeighborsHarris, Kimberly S, FNP   omeprazole (PRILOSEC) 20 MG capsule Take 1 capsule (20 mg total) by mouth daily. 06/30/18   Bing NeighborsHarris, Kimberly S, FNP  predniSONE (DELTASONE) 10 MG tablet Take 4 tablets (40 mg total) by mouth daily for 5 days. 01/25/19 01/30/19  Mickie Bailate, Constantin Hillery H, NP  PROAIR HFA 108 [phone](90 Base) MCG/ACT inhaler INHALE 2 PUFFS INTO THE LUNGS EVERY 4 (FOUR) HOURS AS NEEDED FOR WHEEZING OR SHORTNESS OF BREATH 09/07/18   Bing NeighborsHarris, Kimberly S, FNP    Family History Family History  Problem Relation Age of Onset  . Basal cell carcinoma Mother   . Hypertension Father   . Atrial fibrillation Father   . Hyperlipidemia Father   . Colon cancer Maternal Grandmother   . Breast cancer Maternal Grandmother   . Varicose Veins Maternal Grandmother   . Arthritis Maternal Grandfather   . Diabetes Maternal Grandfather   . Heart disease Maternal Grandfather   . Heart failure Maternal Grandfather   . Heart attack Maternal Grandfather   . Hypertension Maternal Grandfather   . Hyperlipidemia Maternal Grandfather   . Arthritis Paternal Grandmother   . COPD Paternal Grandmother   . Hypertension Paternal Grandmother   . Aortic stenosis Paternal Grandmother   . Stroke Paternal Grandmother   . Cataracts Paternal Grandmother   . Macular degeneration Paternal Grandmother   . Varicose  Veins Paternal Grandmother   . Diabetes Paternal Grandfather   . Hypertension Paternal Grandfather   . Heart attack Paternal Grandfather   . Asthma Brother   . ADD / ADHD Son   . Asthma Son   . ADD / ADHD Son   . Asthma Son   . ADD / ADHD Son   . Asthma Son   . Alcohol abuse Maternal Uncle   . Arthritis Maternal Uncle   . Diabetes Maternal Uncle   . Hypertension Maternal Uncle   . Hyperlipidemia Maternal Uncle   . COPD Maternal Aunt   . Sleep apnea Maternal Aunt   . Diabetes Maternal Aunt   . Heart disease Maternal Aunt   . Lung cancer Maternal Uncle   . Prostate cancer Paternal Uncle   . Breast cancer Paternal Aunt   . Obesity Paternal Aunt   .  Diabetes Paternal Uncle   . Heart attack Paternal Uncle   . Diabetes Paternal Uncle   . Heart attack Paternal Uncle   . Macular degeneration Paternal Uncle   . Diabetes Paternal Uncle   . Hearing loss Paternal Uncle   . Diabetes Paternal Uncle   . Diabetes Paternal Uncle   . Obesity Paternal Uncle   . Diabetes Paternal Aunt   . Hypertension Maternal Aunt   . Hypertension Maternal Aunt     Social History Social History   Tobacco Use  . Smoking status: Current Some Day Smoker    Packs/day: 0.50    Types: Cigarettes  . Smokeless tobacco: Former Neurosurgeon  . Tobacco comment: 0.5 - 1 ppd  Substance Use Topics  . Alcohol use: Not Currently  . Drug use: Never     Allergies   Augmentin [amoxicillin-pot clavulanate], Morphine and related, and Trazodone and nefazodone   Review of Systems Review of Systems  Constitutional: Negative for chills and fever.  HENT: Negative for ear pain and sore throat.   Eyes: Negative for pain and visual disturbance.  Respiratory: Negative for cough and shortness of breath.   Cardiovascular: Negative for chest pain and palpitations.  Gastrointestinal: Negative for abdominal pain and vomiting.  Genitourinary: Negative for dysuria and hematuria.  Musculoskeletal: Positive for back pain. Negative for arthralgias.  Skin: Negative for color change and rash.  Neurological: Negative for seizures and syncope.  All other systems reviewed and are negative.    Physical Exam Triage Vital Signs ED Triage Vitals  Enc Vitals Group     BP 01/25/19 1932 (!) 142/80     Pulse Rate 01/25/19 1932 91     Resp 01/25/19 1932 18     Temp 01/25/19 1932 98.4 F (36.9 C)     Temp Source 01/25/19 1932 Oral     SpO2 01/25/19 1932 100 %     Weight 01/25/19 1930 195 lb (88.5 kg)     Height --      Head Circumference --      Peak Flow --      Pain Score 01/25/19 1930 10     Pain Loc --      Pain Edu? --      Excl. in GC? --    No data found.  Updated Vital Signs  BP (!) 142/80 (BP Location: Right Arm)   Pulse 91   Temp 98.4 F (36.9 C) (Oral)   Resp 18   Wt 195 lb (88.5 kg)   SpO2 100%   BMI 32.45 kg/m   Visual Acuity Right Eye Distance:  Left Eye Distance:   Bilateral Distance:    Right Eye Near:   Left Eye Near:    Bilateral Near:     Physical Exam Vitals signs and nursing note reviewed.  Constitutional:      General: She is not in acute distress.    Appearance: She is well-developed.  HENT:     Head: Normocephalic and atraumatic.  Eyes:     Conjunctiva/sclera: Conjunctivae normal.  Neck:     Musculoskeletal: Neck supple.  Cardiovascular:     Rate and Rhythm: Normal rate and regular rhythm.     Heart sounds: No murmur.  Pulmonary:     Effort: Pulmonary effort is normal. No respiratory distress.     Breath sounds: Normal breath sounds.  Abdominal:     General: Bowel sounds are normal.     Palpations: Abdomen is soft.     Tenderness: There is no abdominal tenderness. There is no right CVA tenderness, left CVA tenderness, guarding or rebound.  Musculoskeletal: Normal range of motion.        General: No tenderness or deformity.  Skin:    General: Skin is warm and dry.     Capillary Refill: Capillary refill takes less than 2 seconds.  Neurological:     General: No focal deficit present.     Mental Status: She is alert and oriented to person, place, and time.     Sensory: No sensory deficit.     Motor: No weakness.     Coordination: Coordination normal.     Gait: Gait normal.     Deep Tendon Reflexes: Reflexes normal.      UC Treatments / Results  Labs (all labs ordered are listed, but only abnormal results are displayed) Labs Reviewed - No data to display  EKG   Radiology No results found.  Procedures Procedures (including critical care time)  Medications Ordered in UC Medications - No data to display  Initial Impression / Assessment and Plan / UC Course  I have reviewed the triage vital signs and the  nursing notes.  Pertinent labs & imaging results that were available during my care of the patient were reviewed by me and considered in my medical decision making (see chart for details).     Acute bilateral low back pain with sciatica.  Treating with prednisone.  Instructed patient that she can continue to take Flexeril and meloxicam as needed.  Instructed her to return here or see an orthopedist if her pain continues or worsens; or she develops new symptoms such as weakness, numbness, bowel or bladder incontinence, or other concerns.  Patient agrees with plan of care.     Final Clinical Impressions(s) / UC Diagnoses   Final diagnoses:  Acute bilateral low back pain with right-sided sciatica     Discharge Instructions     Take the prednisone daily for 5 days.    You can continue to take the Flexeril and meloxicam as needed for discomfort.    Return here or follow-up with an orthopedist if your pain continues or worsens; or if you develop weakness, numbness, loss of control of your bowel or bladder, or other concerning symptoms.        ED Prescriptions    Medication Sig Dispense Auth. Provider   predniSONE (DELTASONE) 10 MG tablet Take 4 tablets (40 mg total) by mouth daily for 5 days. 20 tablet Mickie Bailate, Karrina Lye H, NP     Controlled Substance Prescriptions [place] Controlled Substance Registry consulted? Not Applicable  Sharion Balloon, NP 01/25/19 [phone]

## 2019-01-25 NOTE — ED Triage Notes (Signed)
Pt states she has lower back pain. Pt states she has a ongoing issue with her lower back pain. X 1 week.

## 2019-01-25 NOTE — ED Notes (Signed)
Patient Verbally abusive to this EMT due to "no visitor policy".  I explained to patient that the policy is in place to protect our patients as well as the well public.  Patient appears agitated.

## 2019-01-26 ENCOUNTER — Telehealth: Payer: Self-pay | Admitting: Family Medicine

## 2019-01-26 NOTE — Telephone Encounter (Signed)
[facility] [phone]) is requesting authorization from [place] to schedule patients future appointments with them.  Patient requesting to call her with information and updates so she is not "lost".

## 2019-01-26 NOTE — Telephone Encounter (Signed)
We have never referred her to [facility]. Unable to give authorization.

## 2019-01-26 NOTE — Telephone Encounter (Signed)
When she called she had stated that we're who they need authorization from. She didn't understand why.

## 2019-02-01 ENCOUNTER — Other Ambulatory Visit: Payer: Self-pay

## 2019-02-01 ENCOUNTER — Other Ambulatory Visit: Payer: Medicaid Other

## 2019-02-01 DIAGNOSIS — Z13220 Encounter for screening for lipoid disorders: Secondary | ICD-10-CM

## 2019-02-01 NOTE — Progress Notes (Signed)
Here for fasting lipid.

## 2019-02-02 LAB — LIPID PANEL
Chol/HDL Ratio: 4.6 ratio — ABNORMAL HIGH (ref 0.0–4.4)
Cholesterol, Total: 196 mg/dL (ref 100–199)
HDL: 43 mg/dL (ref >39)
LDL Chol Calc (NIH): 134 mg/dL — ABNORMAL HIGH (ref 0–99)
Triglycerides: 103 mg/dL (ref 0–149)
VLDL Cholesterol Cal: 19 mg/dL (ref 5–40)

## 2019-03-23 ENCOUNTER — Telehealth: Payer: Medicaid Other

## 2019-04-29 ENCOUNTER — Other Ambulatory Visit: Payer: Self-pay

## 2019-04-29 ENCOUNTER — Telehealth: Payer: Medicaid Other

## 2019-05-03 ENCOUNTER — Ambulatory Visit (INDEPENDENT_AMBULATORY_CARE_PROVIDER_SITE_OTHER): Payer: Medicaid Other | Admitting: Physician Assistant

## 2019-05-03 DIAGNOSIS — J45909 Unspecified asthma, uncomplicated: Secondary | ICD-10-CM

## 2019-05-03 DIAGNOSIS — H6983 Other specified disorders of Eustachian tube, bilateral: Secondary | ICD-10-CM | POA: Diagnosis not present

## 2019-05-03 DIAGNOSIS — G4700 Insomnia, unspecified: Secondary | ICD-10-CM

## 2019-05-03 DIAGNOSIS — K219 Gastro-esophageal reflux disease without esophagitis: Secondary | ICD-10-CM

## 2019-05-03 MED ORDER — OMEPRAZOLE 20 MG PO CPDR: 20.0000 mg | DELAYED_RELEASE_CAPSULE | Freq: Every day | ORAL | 1 refills | Status: DC

## 2019-05-03 MED ORDER — HYDROXYZINE HCL 25 MG PO TABS: ORAL_TABLET | ORAL | 1 refills | Status: DC

## 2019-05-03 MED ORDER — ALBUTEROL SULFATE HFA 108 (90 BASE) MCG/ACT IN AERS: INHALATION_SPRAY | RESPIRATORY_TRACT | 2 refills | Status: DC

## 2019-05-03 NOTE — Progress Notes (Signed)
Patient ID: Anne Eaton, female   DOB: 20-Mar-1986, 33 y.o.   MRN: [id number] Virtual Visit via Telephone Note  I connected with Kaoru Benda Larios on 05/03/19 at  9:50 AM EST by telephone and verified that I am speaking with the correct person using two identifiers.   I discussed the limitations, risks, security and privacy concerns of performing an evaluation and management service by telephone and the availability of in person appointments. I also discussed with the patient that there may be a patient responsible charge related to this service. The patient expressed understanding and agreed to proceed.  Patient location:  home My Location:  [place] office Persons on the call:  Me and the patient  History of Present Illness:  Patient still having trouble with insomnia.  Has trouble going to sleep and staying asleep.  Doxepin and trazadone have not worked or caused bad side effects.  Also suffers with anxiety at times.  Ativan has helped for sleep.    Also having ear congestion.  Does not want to use flonase or any nasal spray.    Needs inhaler RF and omeprazole RF.    Observations/Objective:  NAD. A&Ox3  Assessment and Plan: 1. Insomnia, unspecified type Can try: - hydrOXYzine (ATARAX/VISTARIL) 25 MG tablet; 1 to 2 hs prn insomnia  Dispense: 45 tablet; Refill: 1 -also counseled on good sleep hygiene  2. Dysfunction of both eustachian tubes Can try allegra D or mucinex D.    3. Gastroesophageal reflux disease without esophagitis stable - omeprazole (PRILOSEC) 20 MG capsule; Take 1 capsule (20 mg total) by mouth daily.  Dispense: 90 capsule; Refill: 1  4. Extrinsic asthma without complication, unspecified asthma severity, unspecified whether persistent Stable-only using a couple times/week - albuterol (PROAIR HFA) 108 (90 Base) MCG/ACT inhaler; INHALE 2 PUFFS INTO THE LUNGS EVERY 4 (FOUR) HOURS AS NEEDED FOR WHEEZING OR SHORTNESS OF BREATH  Dispense: 18 g; Refill: 2    Follow Up  Instructions: 6 weeks with PCP for insomnia   I discussed the assessment and treatment plan with the patient. The patient was provided an opportunity to ask questions and all were answered. The patient agreed with the plan and demonstrated an understanding of the instructions.   The patient was advised to call back or seek an in-person evaluation if the symptoms worsen or if the condition fails to improve as anticipated.  I provided 15 minutes of non-face-to-face time during this encounter.   Freeman Caldron, PA-C

## 2019-06-06 ENCOUNTER — Other Ambulatory Visit: Payer: Self-pay | Admitting: Family Medicine

## 2019-06-06 ENCOUNTER — Ambulatory Visit
Admission: RE | Admit: 2019-06-06 | Discharge: 2019-06-06 | Disposition: A | Discharge status: Home / Self Care | Payer: Medicaid Other | Source: Ambulatory Visit | Attending: Nurse Practitioner | Admitting: Nurse Practitioner

## 2019-06-06 ENCOUNTER — Ambulatory Visit
Admission: RE | Admit: 2019-06-06 | Discharge: 2019-06-06 | Disposition: A | Discharge status: Home / Self Care | Payer: Medicaid Other | Source: Ambulatory Visit | Attending: Family Medicine | Admitting: Family Medicine

## 2019-06-06 ENCOUNTER — Other Ambulatory Visit: Payer: Self-pay

## 2019-06-06 DIAGNOSIS — N6452 Nipple discharge: Secondary | ICD-10-CM

## 2019-06-06 DIAGNOSIS — N6310 Unspecified lump in the right breast, unspecified quadrant: Secondary | ICD-10-CM

## 2019-06-06 DIAGNOSIS — N6489 Other specified disorders of breast: Secondary | ICD-10-CM

## 2019-06-13 ENCOUNTER — Telehealth: Payer: Self-pay | Admitting: Family Medicine

## 2019-06-13 NOTE — Telephone Encounter (Signed)
Called and confirmed patients phone visit for tomorrow, 06/14/2019 @ 10:10

## 2019-06-14 ENCOUNTER — Ambulatory Visit (INDEPENDENT_AMBULATORY_CARE_PROVIDER_SITE_OTHER): Payer: Medicaid Other | Admitting: Internal Medicine

## 2019-06-14 ENCOUNTER — Encounter: Payer: Self-pay | Admitting: Internal Medicine

## 2019-06-14 DIAGNOSIS — R200 Anesthesia of skin: Secondary | ICD-10-CM

## 2019-06-14 DIAGNOSIS — G4700 Insomnia, unspecified: Secondary | ICD-10-CM | POA: Diagnosis not present

## 2019-06-14 NOTE — Progress Notes (Signed)
Virtual Visit via Telephone Note  I connected with Anne Eaton, on 06/14/2019 at 10:06 AM by telephone due to the COVID-19 pandemic and verified that I am speaking with the correct person using two identifiers.   Consent: I discussed the limitations, risks, security and privacy concerns of performing an evaluation and management service by telephone and the availability of in person appointments. I also discussed with the patient that there may be a patient responsible charge related to this service. The patient expressed understanding and agreed to proceed.   Location of Patient: Home   Location of Provider: Clinic    Persons participating in Telemedicine visit: Anne Eaton Anne Eaton Dr. Earlene Plater      History of Present Illness: Patient has appointment today to f/u insomnia. Has tried Doxepin and Trazodone with either ineffectiveness or side effects. Due to additional history of anxiety, was prescribed Hydroxyzine prn at prior visit on 12/22.  Reports that she is sleeping pretty well. Has had a few nights where the medications haven't kicked in. They take about 2 hours to kick in. Listens to sleep music. Currently this regimen is working for her. Hasn't woken up with any headaches. Wakes up in the middle of the night most nights after sleeping about 3 hours. Most of the time able to fall back asleep; a couple times she has been up for at least an hour and then will fall back asleep. Cutting down screen time prior to bed. Uses sleep meditation stories with her headphones or will use the music.  For right now, she would like to keep her dose the same and try it for at least another month. She takes the Hydroxyzine nightly.    Was sent to orthopedics when she had no feeling/pain in her right leg. Had MRI performed that showed bulging disc on the left side. Was told she had issues with her sacroiliac joint. Was told there was nothing they could do aside from a possible fusion.  Her husband has to help her out of bed. She has no feeling in her left toes at all.    Past Medical History:  Diagnosis Date  . Asthma   . Hypercholesteremia   . Hypoglycemia   . Loss of consciousness (HCC)    Allergies  Allergen Reactions  . Augmentin [Amoxicillin-Pot Clavulanate] Other (See Comments)    Yeast infection  . Morphine And Related Other (See Comments)    Made me crazy  . Trazodone And Nefazodone     Headache     Current Outpatient Medications on File Prior to Visit  Medication Sig Dispense Refill  . albuterol (PROAIR HFA) 108 (90 Base) MCG/ACT inhaler INHALE 2 PUFFS INTO THE LUNGS EVERY 4 (FOUR) HOURS AS NEEDED FOR WHEEZING OR SHORTNESS OF BREATH 18 g 2  . fexofenadine-pseudoephedrine (ALLEGRA-D 24) 180-240 MG 24 hr tablet Take 1 tablet by mouth daily.    . hydrOXYzine (ATARAX/VISTARIL) 25 MG tablet 1 to 2 hs prn insomnia 45 tablet 1  . meloxicam (MOBIC) 15 MG tablet TAKE 1 TABLET BY MOUTH EVERY DAY 30 tablet 3  . omeprazole (PRILOSEC) 20 MG capsule Take 1 capsule (20 mg total) by mouth daily. 90 capsule 1   No current facility-administered medications on file prior to visit.    Observations/Objective: NAD. Speaking clearly.  Work of breathing normal.  Alert and oriented. Mood appropriate.   Assessment and Plan: 1. Insomnia, unspecified type Currently under control with the use of Hydroxyzine and other sleep aids she  uses (meditation apps/sleeping music/screen limiting/etc). Would like to continue the current regimen. Certainly sounds like there is an anxiety component to her insomnia. Has been tried on Zoloft, Xanax, Wellbutrin, Paxil in the past. Has been to therapy in the past. Asked her to consider trial of medication or therapy in the future for more optimal control of depression/anxiety. Recommended Jasmine LCSW for some short term therapy and relaxation technique.   2. Left sided numbness and tingling Advised to f/u with ortho. Inquiring about disability,  discussed she was need to find a licensed physician to perform this exam.   Follow Up Instructions: Follow up prn or for annual exam    I discussed the assessment and treatment plan with the patient. The patient was provided an opportunity to ask questions and all were answered. The patient agreed with the plan and demonstrated an understanding of the instructions.   The patient was advised to call back or seek an in-person evaluation if the symptoms worsen or if the condition fails to improve as anticipated.     I provided 32 minutes total of non-face-to-face time during this encounter including median intraservice time, reviewing previous notes, investigations, ordering medications, medical decision making, coordinating care and patient verbalized understanding at the end of the visit.    Phill Myron, D.O. Primary Care at [facility]  06/14/2019, 10:06 AM

## 2019-06-24 ENCOUNTER — Telehealth: Payer: Self-pay | Admitting: Internal Medicine

## 2019-06-24 ENCOUNTER — Other Ambulatory Visit: Payer: Self-pay | Admitting: Internal Medicine

## 2019-06-24 DIAGNOSIS — G4700 Insomnia, unspecified: Secondary | ICD-10-CM

## 2019-06-24 MED ORDER — HYDROXYZINE HCL 25 MG PO TABS: ORAL_TABLET | ORAL | 1 refills | Status: DC

## 2019-06-24 NOTE — Telephone Encounter (Signed)
Rx sent in.   Marcy Siren, D.O. Primary Care at [facility]  06/24/2019, 10:46 AM

## 2019-06-24 NOTE — Telephone Encounter (Signed)
CALLED PT.

## 2019-06-24 NOTE — Telephone Encounter (Signed)
1) Medication(s) Requested (by name):hydrOXYzine (ATARAX/VISTARIL) 25 MG tablet [[id number]]    2) Pharmacy of Choice: [id number] - [place], [place] - [address] AT [address]  [address],  [place] [address]  3) Special Requests:   Approved medications will be sent to the pharmacy, we will reach out if there is an issue.  Requests made after 3pm may not be addressed until the following business day!  If a patient is unsure of the name of the medication(s) please note and ask patient to call back when they are able to provide all info, do not send to responsible party until all information is available! \

## 2019-06-29 ENCOUNTER — Telehealth: Payer: Self-pay

## 2019-06-29 NOTE — Telephone Encounter (Signed)
Patient called to request an addendum to Dr. Fabio Bering note from 02.12.2020.   I contacted Yevonne Aline with HIM. Larita Fife is going to email Ms. Ludke the proper forms to be completed for this type of request.   I left a message on Ms. Bobo's cell phone to confirm that the email will be coming from [email or web address] either today or tomorrow.

## 2019-08-02 ENCOUNTER — Telehealth: Payer: Self-pay | Admitting: Internal Medicine

## 2019-08-02 NOTE — Telephone Encounter (Signed)
Patient is calling in regards to MRI order. Please contact patient as she is very concerned. Patient stated she is supposed to have an MRI, mammogram and ultrasound every 6 months.

## 2019-08-02 NOTE — Telephone Encounter (Signed)
1) Medication(s) Requested (by name): albuterol (PROAIR HFA) 108 (90 Base) MCG/ACT inhaler [[id number]]  fexofenadine-pseudoephedrine (ALLEGRA-D 24) 180-240 MG 24 hr tablet [[id number]]  hydrOXYzine (ATARAX/VISTARIL) 25 MG tablet [[id number]]  meloxicam (MOBIC) 15 MG tablet [[id number]]  omeprazole (PRILOSEC) 20 MG capsule [[id number]]   2) Pharmacy of Choice: [id number] - [place], [place] - [address]  [address],  [place] [address]     Patient is having trouble with pharmacy due to prescriptions being under other providers. Please send all medications.    Approved medications will be sent to pharmacy, we will reach out to you if there is an issue.  Requests made after 3pm may not be addressed until following business day!

## 2019-08-03 ENCOUNTER — Other Ambulatory Visit: Payer: Self-pay | Admitting: Internal Medicine

## 2019-08-03 DIAGNOSIS — R928 Other abnormal and inconclusive findings on diagnostic imaging of breast: Secondary | ICD-10-CM

## 2019-08-03 DIAGNOSIS — J45909 Unspecified asthma, uncomplicated: Secondary | ICD-10-CM

## 2019-08-03 DIAGNOSIS — G4700 Insomnia, unspecified: Secondary | ICD-10-CM

## 2019-08-03 DIAGNOSIS — K219 Gastro-esophageal reflux disease without esophagitis: Secondary | ICD-10-CM

## 2019-08-03 MED ORDER — FEXOFENADINE-PSEUDOEPHED ER 180-240 MG PO TB24: 1.0000 | ORAL_TABLET | Freq: Every day | ORAL | 3 refills | Status: AC | PRN

## 2019-08-03 MED ORDER — ALBUTEROL SULFATE HFA 108 (90 BASE) MCG/ACT IN AERS: INHALATION_SPRAY | RESPIRATORY_TRACT | 2 refills | Status: DC

## 2019-08-03 MED ORDER — OMEPRAZOLE 20 MG PO CPDR: 20.0000 mg | DELAYED_RELEASE_CAPSULE | Freq: Every day | ORAL | 1 refills | Status: AC

## 2019-08-03 MED ORDER — MELOXICAM 15 MG PO TABS: 15.0000 mg | ORAL_TABLET | Freq: Every day | ORAL | 3 refills | Status: DC | PRN

## 2019-08-03 MED ORDER — HYDROXYZINE HCL 25 MG PO TABS: ORAL_TABLET | ORAL | 1 refills | Status: DC

## 2019-08-03 NOTE — Progress Notes (Signed)
Ultrasound & mammogram have already been scheduled for 12/02/2019(6 months after her previous imaging)

## 2019-08-03 NOTE — Progress Notes (Signed)
Refills sent to pharmacy per patient request.   Marcy Siren, D.O. Primary Care at [facility]  08/03/2019, 9:39 AM

## 2019-08-03 NOTE — Progress Notes (Signed)
I have ordered the breast MRI for patient. I can see that the mammogram and ultrasound orders were already placed by another provider. Are we able to use those orders?   Marcy Siren, D.O. Primary Care at [facility]  08/03/2019, 9:37 AM

## 2019-08-29 ENCOUNTER — Other Ambulatory Visit: Payer: Self-pay | Admitting: Internal Medicine

## 2019-08-29 ENCOUNTER — Telehealth: Payer: Self-pay | Admitting: Internal Medicine

## 2019-08-29 NOTE — Telephone Encounter (Signed)
Submitted PA on [employer]. PA approved. Approval # [id number]. Valid 08/29/2019-02/25/2020.

## 2019-08-29 NOTE — Telephone Encounter (Signed)
[facility] called regarding patient MRI of breast that is scheduled for Wednesday. Patient needs a pre [place]. [facility] gave the number to do the pre auth. [phone]

## 2019-08-31 ENCOUNTER — Other Ambulatory Visit: Payer: Self-pay

## 2019-08-31 ENCOUNTER — Ambulatory Visit
Admission: RE | Admit: 2019-08-31 | Discharge: 2019-08-31 | Disposition: A | Discharge status: Home / Self Care | Payer: Medicaid Other | Source: Ambulatory Visit | Attending: Internal Medicine | Admitting: Internal Medicine

## 2019-08-31 DIAGNOSIS — R928 Other abnormal and inconclusive findings on diagnostic imaging of breast: Secondary | ICD-10-CM

## 2019-08-31 MED ORDER — GADOBUTROL 1 MMOL/ML IV SOLN
9.0000 mL | Freq: Once | INTRAVENOUS | Status: AC | PRN
  Administered 2019-08-31: 9 mL via INTRAVENOUS

## 2019-10-04 DIAGNOSIS — Z0271 Encounter for disability determination: Secondary | ICD-10-CM

## 2019-10-27 ENCOUNTER — Telehealth: Payer: Medicaid Other | Admitting: Internal Medicine

## 2019-11-21 ENCOUNTER — Encounter: Payer: Self-pay | Admitting: Internal Medicine

## 2019-11-21 ENCOUNTER — Telehealth (INDEPENDENT_AMBULATORY_CARE_PROVIDER_SITE_OTHER): Payer: Medicaid Other | Admitting: Internal Medicine

## 2019-11-21 DIAGNOSIS — R232 Flushing: Secondary | ICD-10-CM | POA: Diagnosis not present

## 2019-11-21 DIAGNOSIS — G8929 Other chronic pain: Secondary | ICD-10-CM | POA: Diagnosis not present

## 2019-11-21 DIAGNOSIS — M545 Low back pain: Secondary | ICD-10-CM

## 2019-11-21 DIAGNOSIS — N898 Other specified noninflammatory disorders of vagina: Secondary | ICD-10-CM | POA: Diagnosis not present

## 2019-11-21 NOTE — Progress Notes (Signed)
Virtual Visit via Telephone Note  I connected with Tida L Oubre, on 11/21/2019 at 2:36 PM by telephone due to the COVID-19 pandemic and verified that I am speaking with the correct person using two identifiers.   Consent: I discussed the limitations, risks, security and privacy concerns of performing an evaluation and management service by telephone and the availability of in person appointments. I also discussed with the patient that there may be a patient responsible charge related to this service. The patient expressed understanding and agreed to proceed.   Location of Patient: Home   Location of Provider: Clinic    Persons participating in Telemedicine visit: Nautia Lem Lewing [name] Dr. Earlene Plater      History of Present Illness: Patient has a same day televisit for concerns about chronic pain. Was told her MRI report shows herniated disc at L1-2 and L5-S1. She sees Dr. Farris Has at [facility]. Has been told that her only option is steroid injection in her spine and her and her husband have determined that this is not an option for her. She has low back pain and bilateral hip pain. She has had loss of feeling in her left toes and left hand that occurs recurrently for a few hours and this has been going on for several months. No changes in bladder or bowel function. No saddle anesthesia. No fevers.   Patient with history of hysterectomy in her 97s. OB-GYN removed right ovary due to endometriosis and cysts. However, did not remove left ovary due to concern about starting early menopause. Reports she has had chronic left sided pelvic pain since hysterectomy that she attributed to ovulation. This pain stopped two years ago. Now has concerns about vaginal dryness for the past one to two months. Despite using lubrication, unable to tolerate sex due to pain with the dryness. Libido has decreased. Has been experiencing hot flashes for two years.    Past Medical History:  Diagnosis  Date  . Asthma   . Hypercholesteremia   . Hypoglycemia   . Loss of consciousness (HCC)    Allergies  Allergen Reactions  . Augmentin [Amoxicillin-Pot Clavulanate] Other (See Comments)    Yeast infection  . Morphine And Related Other (See Comments)    Made me crazy  . Trazodone And Nefazodone     Headache     Current Outpatient Medications on File Prior to Visit  Medication Sig Dispense Refill  . albuterol (PROAIR HFA) 108 (90 Base) MCG/ACT inhaler INHALE 2 PUFFS INTO THE LUNGS EVERY 4 (FOUR) HOURS AS NEEDED FOR WHEEZING OR SHORTNESS OF BREATH 18 g 2  . fexofenadine-pseudoephedrine (ALLEGRA-D 24) 180-240 MG 24 hr tablet Take 1 tablet by mouth daily as needed. 30 tablet 3  . omeprazole (PRILOSEC) 20 MG capsule Take 1 capsule (20 mg total) by mouth daily. 90 capsule 1   No current facility-administered medications on file prior to visit.    Observations/Objective: NAD. Speaking clearly.  Work of breathing normal.  Alert and oriented. Mood appropriate.   Assessment and Plan: 1. Chronic bilateral low back pain without sciatica Unfortunately, I am unable to review previous records from orthopedic doctor or the MRI report. Pain sounds consistent with herniated discs within her lumbar spine. No symptoms concerning for cauda equina syndrome. Will refer to another orthopedic doctor for second opinion as patient does not want to have spinal corticosteroid injections done for pain management.  - AMB referral to orthopedics  2. Vaginal dryness 3. Hot flashes Will obtain labs to  evaluate for POI/early menopause and other endocrine abnormalities. She does have a history of right oophorectomy and hysterectomy.  - TSH; Future - FSH/LH; Future - Estradiol; Future - Prolactin; Future    Follow Up Instructions: Lab visit 7/26    I discussed the assessment and treatment plan with the patient. The patient was provided an opportunity to ask questions and all were answered. The patient agreed  with the plan and demonstrated an understanding of the instructions.   The patient was advised to call back or seek an in-person evaluation if the symptoms worsen or if the condition fails to improve as anticipated.     I provided 28 minutes total of non-face-to-face time during this encounter including median intraservice time, reviewing previous notes, investigations, ordering medications, medical decision making, coordinating care and patient verbalized understanding at the end of the visit.    Marcy Siren, D.O. Primary Care at [facility]  11/21/2019, 2:36 PM

## 2019-12-02 ENCOUNTER — Other Ambulatory Visit: Payer: Self-pay

## 2019-12-02 ENCOUNTER — Ambulatory Visit
Admission: RE | Admit: 2019-12-02 | Discharge: 2019-12-02 | Disposition: A | Discharge status: Home / Self Care | Payer: BC Managed Care – PPO | Source: Ambulatory Visit | Attending: Nurse Practitioner | Admitting: Nurse Practitioner

## 2019-12-02 ENCOUNTER — Ambulatory Visit
Admission: RE | Admit: 2019-12-02 | Discharge: 2019-12-02 | Disposition: A | Discharge status: Home / Self Care | Payer: Medicaid Other | Source: Ambulatory Visit | Attending: Nurse Practitioner | Admitting: Nurse Practitioner

## 2019-12-02 DIAGNOSIS — N6310 Unspecified lump in the right breast, unspecified quadrant: Secondary | ICD-10-CM

## 2019-12-02 DIAGNOSIS — N6313 Unspecified lump in the right breast, lower outer quadrant: Secondary | ICD-10-CM | POA: Diagnosis not present

## 2019-12-02 DIAGNOSIS — R922 Inconclusive mammogram: Secondary | ICD-10-CM | POA: Diagnosis not present

## 2019-12-02 DIAGNOSIS — N6311 Unspecified lump in the right breast, upper outer quadrant: Secondary | ICD-10-CM | POA: Diagnosis not present

## 2019-12-05 ENCOUNTER — Other Ambulatory Visit: Payer: Self-pay

## 2019-12-05 ENCOUNTER — Other Ambulatory Visit (INDEPENDENT_AMBULATORY_CARE_PROVIDER_SITE_OTHER): Payer: BC Managed Care – PPO

## 2019-12-05 DIAGNOSIS — N898 Other specified noninflammatory disorders of vagina: Secondary | ICD-10-CM

## 2019-12-05 DIAGNOSIS — R232 Flushing: Secondary | ICD-10-CM | POA: Diagnosis not present

## 2019-12-06 LAB — FSH/LH
FSH: 6.8 m[IU]/mL
LH: 5.6 m[IU]/mL

## 2019-12-06 LAB — ESTRADIOL: Estradiol: 55.9 pg/mL

## 2019-12-06 LAB — TSH: TSH: 1.64 u[IU]/mL (ref 0.450–4.500)

## 2019-12-06 LAB — PROLACTIN: Prolactin: 17 ng/mL (ref 4.8–23.3)

## 2019-12-09 ENCOUNTER — Other Ambulatory Visit: Payer: Self-pay

## 2019-12-09 ENCOUNTER — Ambulatory Visit
Admission: RE | Admit: 2019-12-09 | Discharge: 2019-12-09 | Disposition: A | Discharge status: Home / Self Care | Payer: BC Managed Care – PPO | Source: Ambulatory Visit | Attending: Family Medicine | Admitting: Family Medicine

## 2019-12-09 VITALS — BP 113/66 | HR 85 | Temp 98.2°F | Resp 18

## 2019-12-09 DIAGNOSIS — N941 Unspecified dyspareunia: Secondary | ICD-10-CM | POA: Diagnosis not present

## 2019-12-09 DIAGNOSIS — J45909 Unspecified asthma, uncomplicated: Secondary | ICD-10-CM | POA: Insufficient documentation

## 2019-12-09 DIAGNOSIS — R102 Pelvic and perineal pain unspecified side: Secondary | ICD-10-CM

## 2019-12-09 DIAGNOSIS — R635 Abnormal weight gain: Secondary | ICD-10-CM

## 2019-12-09 DIAGNOSIS — R5383 Other fatigue: Secondary | ICD-10-CM

## 2019-12-09 DIAGNOSIS — N898 Other specified noninflammatory disorders of vagina: Secondary | ICD-10-CM | POA: Diagnosis not present

## 2019-12-09 DIAGNOSIS — F17210 Nicotine dependence, cigarettes, uncomplicated: Secondary | ICD-10-CM | POA: Diagnosis not present

## 2019-12-09 DIAGNOSIS — Z79899 Other long term (current) drug therapy: Secondary | ICD-10-CM | POA: Insufficient documentation

## 2019-12-09 DIAGNOSIS — R61 Generalized hyperhidrosis: Secondary | ICD-10-CM

## 2019-12-09 LAB — POCT URINALYSIS DIP (MANUAL ENTRY)
Bilirubin, UA: NEGATIVE
Blood, UA: NEGATIVE
Glucose, UA: NEGATIVE mg/dL
Ketones, POC UA: NEGATIVE mg/dL
Leukocytes, UA: NEGATIVE
Nitrite, UA: NEGATIVE
Protein Ur, POC: NEGATIVE mg/dL
Spec Grav, UA: >=1.03 — AB (ref 1.010–1.025)
Urobilinogen, UA: 0.2 E.U./dL
pH, UA: 5.5 (ref 5.0–8.0)

## 2019-12-09 MED ORDER — FLUCONAZOLE 200 MG PO TABS: 200.0000 mg | ORAL_TABLET | Freq: Once | ORAL | 0 refills | Status: AC

## 2019-12-09 MED ORDER — METRONIDAZOLE 500 MG PO TABS
500.0000 mg | ORAL_TABLET | Freq: Two times a day (BID) | ORAL | 0 refills | Status: DC
Start: 2019-12-09 — End: 2020-01-27

## 2019-12-09 NOTE — ED Provider Notes (Addendum)
[facility]    CSN: [id number] Arrival date & time: 12/09/19  3545      History   Chief Complaint Chief Complaint  Patient presents with  . Vaginal Discharge    HPI Anne Eaton is a 34 y.o. female.   Reports multiple complaints. Reports that she has been experiencing fatigue, weight gain, vaginal dryness, dyspareunia, night sweats. Reports that she has had multiple conversations with her primary care regarding these issues to no avail. Has tried OTC lubricants with no relief. Reports that she has also had blood work done to check on hormone levels and these have all come back normal. Denies dysuria, frequency, urgency, pelvic pain, cough, sore throat, headache, nausea, vomiting, diarrhea, rash, fever, other symptoms.  ROS per HPI  The history is provided by the patient.  Vaginal Discharge   Past Medical History:  Diagnosis Date  . Asthma   . Hypercholesteremia   . Hypoglycemia   . Loss of consciousness [facility])     Patient Active Problem List   Diagnosis Date Noted  . Alteration consciousness 07/29/2018  . Extrinsic asthma without complication 07/02/2018  . Syncope 07/02/2018  . Left sided numbness and tingling 07/02/2018  . Gastroesophageal reflux disease without esophagitis 07/02/2018    Past Surgical History:  Procedure Laterality Date  . ABDOMINAL HYSTERECTOMY    . CESAREAN SECTION     x 3  . GALLBLADDER SURGERY    . TONSILLECTOMY    . WISDOM TOOTH EXTRACTION      OB History   No obstetric history on file.      Home Medications    Prior to Admission medications   Medication Sig Start Date End Date Taking? Authorizing Provider  albuterol (PROAIR HFA) 108 (90 Base) MCG/ACT inhaler INHALE 2 PUFFS INTO THE LUNGS EVERY 4 (FOUR) HOURS AS NEEDED FOR WHEEZING OR SHORTNESS OF BREATH 08/03/19   Arvilla Market, DO  fexofenadine-pseudoephedrine (ALLEGRA-D 24) 180-240 MG 24 hr tablet Take 1 tablet by mouth daily as needed. 08/03/19    Arvilla Market, DO  fluconazole (DIFLUCAN) 200 MG tablet Take 1 tablet (200 mg total) by mouth once for 1 dose. 12/09/19 12/09/19  Moshe Cipro, NP  metroNIDAZOLE (FLAGYL) 500 MG tablet Take 1 tablet (500 mg total) by mouth 2 (two) times daily. 12/09/19   Moshe Cipro, NP  omeprazole (PRILOSEC) 20 MG capsule Take 1 capsule (20 mg total) by mouth daily. 08/03/19   Arvilla Market, DO    Family History Family History  Problem Relation Age of Onset  . Basal cell carcinoma Mother   . Hypertension Father   . Atrial fibrillation Father   . Hyperlipidemia Father   . Colon cancer Maternal Grandmother   . Breast cancer Maternal Grandmother   . Varicose Veins Maternal Grandmother   . Arthritis Maternal Grandfather   . Diabetes Maternal Grandfather   . Heart disease Maternal Grandfather   . Heart failure Maternal Grandfather   . Heart attack Maternal Grandfather   . Hypertension Maternal Grandfather   . Hyperlipidemia Maternal Grandfather   . Arthritis Paternal Grandmother   . COPD Paternal Grandmother   . Hypertension Paternal Grandmother   . Aortic stenosis Paternal Grandmother   . Stroke Paternal Grandmother   . Cataracts Paternal Grandmother   . Macular degeneration Paternal Grandmother   . Varicose Veins Paternal Grandmother   . Diabetes Paternal Grandfather   . Hypertension Paternal Grandfather   . Heart attack Paternal Grandfather   . Asthma Brother   .  ADD / ADHD Son   . Asthma Son   . ADD / ADHD Son   . Asthma Son   . ADD / ADHD Son   . Asthma Son   . Alcohol abuse Maternal Uncle   . Arthritis Maternal Uncle   . Diabetes Maternal Uncle   . Hypertension Maternal Uncle   . Hyperlipidemia Maternal Uncle   . COPD Maternal Aunt   . Sleep apnea Maternal Aunt   . Diabetes Maternal Aunt   . Heart disease Maternal Aunt   . Lung cancer Maternal Uncle   . Prostate cancer Paternal Uncle   . Breast cancer Paternal Aunt   . Obesity Paternal Aunt     . Diabetes Paternal Uncle   . Heart attack Paternal Uncle   . Diabetes Paternal Uncle   . Heart attack Paternal Uncle   . Macular degeneration Paternal Uncle   . Diabetes Paternal Uncle   . Hearing loss Paternal Uncle   . Diabetes Paternal Uncle   . Diabetes Paternal Uncle   . Obesity Paternal Uncle   . Diabetes Paternal Aunt   . Hypertension Maternal Aunt   . Hypertension Maternal Aunt     Social History Social History   Tobacco Use  . Smoking status: Current Some Day Smoker    Packs/day: 0.50    Types: Cigarettes  . Smokeless tobacco: Former Neurosurgeon  . Tobacco comment: 0.5 - 1 ppd  Vaping Use  . Vaping Use: Never used  Substance Use Topics  . Alcohol use: Not Currently  . Drug use: Never     Allergies   Augmentin [amoxicillin-pot clavulanate], Morphine and related, and Trazodone and nefazodone   Review of Systems Review of Systems  Genitourinary: Positive for vaginal discharge.     Physical Exam Triage Vital Signs ED Triage Vitals  Enc Vitals Group     BP 12/09/19 0846 113/66     Pulse Rate 12/09/19 0846 85     Resp 12/09/19 0846 18     Temp 12/09/19 0846 98.2 F (36.8 C)     Temp src --      SpO2 12/09/19 0846 97 %     Weight --      Height --      Head Circumference --      Peak Flow --      Pain Score 12/09/19 0842 0     Pain Loc --      Pain Edu? --      Excl. in GC? --    No data found.  Updated Vital Signs BP 113/66   Pulse 85   Temp 98.2 F (36.8 C)   Resp 18   SpO2 97%      Physical Exam Vitals and nursing note reviewed.  Constitutional:      General: She is not in acute distress.    Appearance: Normal appearance. She is well-developed. She is not ill-appearing.  HENT:     Head: Normocephalic and atraumatic.  Eyes:     Conjunctiva/sclera: Conjunctivae normal.  Cardiovascular:     Rate and Rhythm: Normal rate and regular rhythm.     Heart sounds: Normal heart sounds. No murmur heard.   Pulmonary:     Effort: Pulmonary  effort is normal. No respiratory distress.     Breath sounds: Normal breath sounds.  Abdominal:     General: Bowel sounds are normal. There is no distension.     Palpations: Abdomen is soft. There is no mass.  Tenderness: There is no abdominal tenderness. There is no right CVA tenderness, left CVA tenderness, guarding or rebound.     Hernia: No hernia is present.  Musculoskeletal:        General: Normal range of motion.     Cervical back: Neck supple.  Skin:    General: Skin is warm and dry.     Capillary Refill: Capillary refill takes less than 2 seconds.  Neurological:     General: No focal deficit present.     Mental Status: She is alert.  Psychiatric:        Mood and Affect: Mood normal.        Thought Content: Thought content normal.      UC Treatments / Results  Labs (all labs ordered are listed, but only abnormal results are displayed) Labs Reviewed  POCT URINALYSIS DIP (MANUAL ENTRY) - Abnormal; Notable for the following components:      Result Value   Spec Grav, UA >=1.030 (*)    All other components within normal limits  TSH  T3  T4, FREE  CERVICOVAGINAL ANCILLARY ONLY    EKG   Radiology No results found.  Procedures Procedures (including critical care time)  Medications Ordered in UC Medications - No data to display  Initial Impression / Assessment and Plan / UC Course  I have reviewed the triage vital signs and the nursing notes.  Pertinent labs & imaging results that were available during my care of the patient were reviewed by me and considered in my medical decision making (see chart for details).     Vaginal Pain  Vaginal Irritation Night Sweats Fatigue Weight Gain Dyspareunia  Deferred pelvic exam Presents with vaginal dryness and irritation, dyspareunia Has had previous hx BV and yeast Prescribed flagyl Prescribed fluconazole Swab tests pending Will call with abnormal results and treat accordingly Has had partial hysterectomy,  still has one ovary Has not had labs (CBC, etc) in over a year  Reports ovarian cysts, with no PCOS diagnosis Will run thyroid panel to rule out causes of symptoms Will follow up with abnormal results and treat accordingly Follow up with PCP and GYN Follow up with the ER for trouble swallowing, trouble breathing, unrelenting pain, other concerning symptoms Verbalizes understanding and agrees with treatment plan  Final Clinical Impressions(s) / UC Diagnoses   Final diagnoses:  Vaginal pain  Night sweats  Other fatigue  Weight gain  Vaginal irritation     Discharge Instructions     I have attached information for [facility]  I have sent in metronidazole and flagyl  Lotrimin for your husband  Swab testing will be back in about 2 days, we are also checking TSH, T3 and free T4. If these are abnormal we will call you and treat accordingly  If everything is normal, I would consider topical estrogen  Follow up with gynecology as needed    ED Prescriptions    Medication Sig Dispense Auth. Provider   metroNIDAZOLE (FLAGYL) 500 MG tablet Take 1 tablet (500 mg total) by mouth 2 (two) times daily. 14 tablet Moshe Cipro, NP   fluconazole (DIFLUCAN) 200 MG tablet Take 1 tablet (200 mg total) by mouth once for 1 dose. 2 tablet Moshe Cipro, NP     PDMP not reviewed this encounter.   Moshe Cipro, NP 12/09/19 1159    Moshe Cipro, NP 12/09/19 1200

## 2019-12-09 NOTE — Discharge Instructions (Addendum)
I have attached information for [facility]  I have sent in metronidazole and flagyl  Lotrimin for your husband  Swab testing will be back in about 2 days, we are also checking TSH, T3 and free T4. If these are abnormal we will call you and treat accordingly  If everything is normal, I would consider topical estrogen  Follow up with gynecology as needed

## 2019-12-09 NOTE — ED Triage Notes (Signed)
Pt presents with white vaginal discharge after trying lubricant products. Pt not concerned about std

## 2019-12-10 LAB — T4, FREE: Free T4: 1.29 ng/dL (ref 0.82–1.77)

## 2019-12-10 LAB — T3: T3, Total: 134 ng/dL (ref 71–180)

## 2019-12-10 LAB — TSH: TSH: 1.12 u[IU]/mL (ref 0.450–4.500)

## 2019-12-11 ENCOUNTER — Encounter: Payer: Self-pay | Admitting: Emergency Medicine

## 2019-12-11 ENCOUNTER — Ambulatory Visit
Admission: EM | Admit: 2019-12-11 | Discharge: 2019-12-11 | Disposition: A | Discharge status: Home / Self Care | Payer: BC Managed Care – PPO | Attending: Family Medicine | Admitting: Family Medicine

## 2019-12-11 DIAGNOSIS — N898 Other specified noninflammatory disorders of vagina: Secondary | ICD-10-CM | POA: Diagnosis not present

## 2019-12-11 DIAGNOSIS — N941 Unspecified dyspareunia: Secondary | ICD-10-CM

## 2019-12-11 DIAGNOSIS — R61 Generalized hyperhidrosis: Secondary | ICD-10-CM | POA: Diagnosis not present

## 2019-12-11 DIAGNOSIS — Z90711 Acquired absence of uterus with remaining cervical stump: Secondary | ICD-10-CM

## 2019-12-11 DIAGNOSIS — R5383 Other fatigue: Secondary | ICD-10-CM | POA: Diagnosis present

## 2019-12-11 DIAGNOSIS — Z8742 Personal history of other diseases of the female genital tract: Secondary | ICD-10-CM | POA: Diagnosis not present

## 2019-12-11 MED ORDER — ESTRADIOL 0.75 MG/1.25 GM (0.06%) TD GEL: 1.2500 g | Freq: Every day | TRANSDERMAL | 2 refills | Status: AC

## 2019-12-11 NOTE — Discharge Instructions (Signed)
I have sent in estrogel for you to use daily  We will call with any abnormal labs and will treat from there  Follow up with this office or primary care as needed

## 2019-12-11 NOTE — ED Triage Notes (Signed)
Pt states she was told to come here fasting  to have lab work for her "insulin levels"

## 2019-12-11 NOTE — ED Provider Notes (Signed)
[facility]    CSN: [id number] Arrival date & time: 12/11/19  7902      History   Chief Complaint Chief Complaint  Patient presents with  . Labs Only    HPI Anne Eaton is a 33 y.o. female.   Reports that she is here for follow up after the visit x 2 days ago. Reports that she has been experiencing night sweats, vaginal pain, dyspareunia, fatigue, and with normal thyroid labs, she is inquiring about what to do next. She has had televisits with her primary care and she has not been able to confirm a diagnosis. Reports that she has had a partial hysterectomy and has one ovary remaining. Reports that this ovary has had cysts in the past. Reports that she does not have established gynecological care in this area. Has moved from [facility] recently. Vaginal swab results are still pending from 2 days ago. Reports that her discharge has changed from white to yellowish in color. Denies headache, cough, sore throat, nausea, vomiting, diarrhea, fever, rash, other symptoms.  ROS per HPI  The history is provided by the patient.    Past Medical History:  Diagnosis Date  . Asthma   . Hypercholesteremia   . Hypoglycemia   . Loss of consciousness [facility])     Patient Active Problem List   Diagnosis Date Noted  . Other fatigue 12/11/2019  . Night sweats 12/11/2019  . Dyspareunia, female 12/11/2019  . Hx of ovarian cyst 12/11/2019  . History of partial hysterectomy 12/11/2019  . Vaginal dryness 12/11/2019  . Alteration consciousness 07/29/2018  . Extrinsic asthma without complication 07/02/2018  . Syncope 07/02/2018  . Left sided numbness and tingling 07/02/2018  . Gastroesophageal reflux disease without esophagitis 07/02/2018    Past Surgical History:  Procedure Laterality Date  . ABDOMINAL HYSTERECTOMY    . CESAREAN SECTION     x 3  . GALLBLADDER SURGERY    . TONSILLECTOMY    . WISDOM TOOTH EXTRACTION      OB History   No obstetric history on file.      Home  Medications    Prior to Admission medications   Medication Sig Start Date End Date Taking? Authorizing Provider  albuterol (PROAIR HFA) 108 (90 Base) MCG/ACT inhaler INHALE 2 PUFFS INTO THE LUNGS EVERY 4 (FOUR) HOURS AS NEEDED FOR WHEEZING OR SHORTNESS OF BREATH 08/03/19   Arvilla Market, DO  Estradiol 0.75 MG/1.25 GM (0.06%) topical gel Place 1.25 g onto the skin daily. 12/11/19   Moshe Cipro, NP  fexofenadine-pseudoephedrine (ALLEGRA-D 24) 180-240 MG 24 hr tablet Take 1 tablet by mouth daily as needed. 08/03/19   Arvilla Market, DO  metroNIDAZOLE (FLAGYL) 500 MG tablet Take 1 tablet (500 mg total) by mouth 2 (two) times daily. 12/09/19   Moshe Cipro, NP  omeprazole (PRILOSEC) 20 MG capsule Take 1 capsule (20 mg total) by mouth daily. 08/03/19   Arvilla Market, DO    Family History Family History  Problem Relation Age of Onset  . Basal cell carcinoma Mother   . Hypertension Father   . Atrial fibrillation Father   . Hyperlipidemia Father   . Colon cancer Maternal Grandmother   . Breast cancer Maternal Grandmother   . Varicose Veins Maternal Grandmother   . Arthritis Maternal Grandfather   . Diabetes Maternal Grandfather   . Heart disease Maternal Grandfather   . Heart failure Maternal Grandfather   . Heart attack Maternal Grandfather   . Hypertension Maternal  Grandfather   . Hyperlipidemia Maternal Grandfather   . Arthritis Paternal Grandmother   . COPD Paternal Grandmother   . Hypertension Paternal Grandmother   . Aortic stenosis Paternal Grandmother   . Stroke Paternal Grandmother   . Cataracts Paternal Grandmother   . Macular degeneration Paternal Grandmother   . Varicose Veins Paternal Grandmother   . Diabetes Paternal Grandfather   . Hypertension Paternal Grandfather   . Heart attack Paternal Grandfather   . Asthma Brother   . ADD / ADHD Son   . Asthma Son   . ADD / ADHD Son   . Asthma Son   . ADD / ADHD Son   . Asthma Son     . Alcohol abuse Maternal Uncle   . Arthritis Maternal Uncle   . Diabetes Maternal Uncle   . Hypertension Maternal Uncle   . Hyperlipidemia Maternal Uncle   . COPD Maternal Aunt   . Sleep apnea Maternal Aunt   . Diabetes Maternal Aunt   . Heart disease Maternal Aunt   . Lung cancer Maternal Uncle   . Prostate cancer Paternal Uncle   . Breast cancer Paternal Aunt   . Obesity Paternal Aunt   . Diabetes Paternal Uncle   . Heart attack Paternal Uncle   . Diabetes Paternal Uncle   . Heart attack Paternal Uncle   . Macular degeneration Paternal Uncle   . Diabetes Paternal Uncle   . Hearing loss Paternal Uncle   . Diabetes Paternal Uncle   . Diabetes Paternal Uncle   . Obesity Paternal Uncle   . Diabetes Paternal Aunt   . Hypertension Maternal Aunt   . Hypertension Maternal Aunt     Social History Social History   Tobacco Use  . Smoking status: Current Some Day Smoker    Packs/day: 0.50    Types: Cigarettes  . Smokeless tobacco: Former NeurosurgeonUser  . Tobacco comment: 0.5 - 1 ppd  Vaping Use  . Vaping Use: Never used  Substance Use Topics  . Alcohol use: Not Currently  . Drug use: Never     Allergies   Augmentin [amoxicillin-pot clavulanate], Morphine and related, and Trazodone and nefazodone   Review of Systems Review of Systems   Physical Exam Triage Vital Signs ED Triage Vitals  Enc Vitals Group     BP      Pulse      Resp      Temp      Temp src      SpO2      Weight      Height      Head Circumference      Peak Flow      Pain Score      Pain Loc      Pain Edu?      Excl. in GC?    No data found.  Updated Vital Signs BP 104/67 (BP Location: Right Arm)   Pulse 70   Temp 98.2 F (36.8 C) (Oral)   Resp 17   Ht 5\' 3"  (1.6 m)   Wt 194 lb 0.1 oz (88 kg)   SpO2 95%   BMI 34.37 kg/m   Visual Acuity Right Eye Distance:   Left Eye Distance:   Bilateral Distance:    Right Eye Near:   Left Eye Near:    Bilateral Near:     Physical Exam Vitals  and nursing note reviewed.  Constitutional:      General: She is not in acute distress.  Appearance: Normal appearance. She is well-developed. She is obese.  HENT:     Head: Normocephalic and atraumatic.  Eyes:     Extraocular Movements: Extraocular movements intact.     Conjunctiva/sclera: Conjunctivae normal.     Pupils: Pupils are equal, round, and reactive to light.  Cardiovascular:     Rate and Rhythm: Normal rate and regular rhythm.     Heart sounds: Normal heart sounds. No murmur heard.   Pulmonary:     Effort: Pulmonary effort is normal. No respiratory distress.     Breath sounds: Normal breath sounds. No stridor. No wheezing, rhonchi or rales.  Chest:     Chest wall: No tenderness.  Abdominal:     General: Bowel sounds are normal. There is no distension.     Palpations: Abdomen is soft. There is no mass.     Tenderness: There is no abdominal tenderness. There is no right CVA tenderness, left CVA tenderness, guarding or rebound.     Hernia: No hernia is present.  Musculoskeletal:        General: Normal range of motion.     Cervical back: Normal range of motion and neck supple.  Skin:    General: Skin is warm and dry.     Capillary Refill: Capillary refill takes less than 2 seconds.  Neurological:     General: No focal deficit present.     Mental Status: She is alert and oriented to person, place, and time.  Psychiatric:        Mood and Affect: Mood normal.        Behavior: Behavior normal.        Thought Content: Thought content normal.      UC Treatments / Results  Labs (all labs ordered are listed, but only abnormal results are displayed) Labs Reviewed  CBC WITH DIFFERENTIAL/PLATELET  COMPREHENSIVE METABOLIC PANEL  INSULIN, RANDOM    EKG   Radiology No results found.  Procedures Procedures (including critical care time)  Medications Ordered in UC Medications - No data to display  Initial Impression / Assessment and Plan / UC Course  I have  reviewed the triage vital signs and the nursing notes.  Pertinent labs & imaging results that were available during my care of the patient were reviewed by me and considered in my medical decision making (see chart for details).     Fatigue Night Sweats Dyspareunia History of Ovarian Cysts Hx partial hysterectomy Vaginal Dryness  Presents for follow up after thyroid labs x 2 days ago Thyroid was normal Vaginal swab testing still pending Will call with positive results that need further treatment Taking metronidazole and fluconazole Prescribed estrogel for vaginal dryness Encouraged the use of lubricants with sex CBC, CMP, insulin pending Will call with abnormal results and treat as needed Follow up with this office or with primary care as needed Follow up with gynecology for further management Follow up with the ER for high fever, trouble swallowing, trouble breathing, other concerning symptoms Final Clinical Impressions(s) / UC Diagnoses   Final diagnoses:  Other fatigue  Night sweats  Dyspareunia, female  Hx of ovarian cyst  History of partial hysterectomy  Vaginal dryness     Discharge Instructions     I have sent in estrogel for you to use daily  We will call with any abnormal labs and will treat from there  Follow up with this office or primary care as needed      ED Prescriptions    Medication  Sig Dispense Auth. Provider   Estradiol 0.75 MG/1.25 GM (0.06%) topical gel Place 1.25 g onto the skin daily. 50 g Moshe Cipro, NP     PDMP not reviewed this encounter.   Moshe Cipro, NP 12/11/19 1050

## 2019-12-12 LAB — COMPREHENSIVE METABOLIC PANEL
ALT: 24 IU/L (ref 0–32)
AST: <5 IU/L (ref 0–40)
Albumin/Globulin Ratio: 1.6 (ref 1.2–2.2)
Albumin: 4.2 g/dL (ref 3.8–4.8)
Alkaline Phosphatase: 95 IU/L (ref 48–121)
BUN/Creatinine Ratio: 14 (ref 9–23)
BUN: 11 mg/dL (ref 6–20)
Bilirubin Total: 0.2 mg/dL (ref 0.0–1.2)
CO2: 21 mmol/L (ref 20–29)
Calcium: 9.3 mg/dL (ref 8.7–10.2)
Chloride: 103 mmol/L (ref 96–106)
Creatinine, Ser: 0.81 mg/dL (ref 0.57–1.00)
GFR calc Af Amer: 108 mL/min/{1.73_m2} (ref >59)
GFR calc non Af Amer: 94 mL/min/{1.73_m2} (ref >59)
Globulin, Total: 2.7 g/dL (ref 1.5–4.5)
Glucose: 98 mg/dL (ref 65–99)
Potassium: 4.1 mmol/L (ref 3.5–5.2)
Sodium: 138 mmol/L (ref 134–144)
Total Protein: 6.9 g/dL (ref 6.0–8.5)

## 2019-12-12 LAB — CBC WITH DIFFERENTIAL/PLATELET
Basophils Absolute: 0.1 10*3/uL (ref 0.0–0.2)
Basos: 1 %
EOS (ABSOLUTE): 0.1 10*3/uL (ref 0.0–0.4)
Eos: 1 %
Hematocrit: 44.1 % (ref 34.0–46.6)
Hemoglobin: 14.9 g/dL (ref 11.1–15.9)
Immature Grans (Abs): 0 10*3/uL (ref 0.0–0.1)
Immature Granulocytes: 0 %
Lymphocytes Absolute: 2.2 10*3/uL (ref 0.7–3.1)
Lymphs: 27 %
MCH: 31.8 pg (ref 26.6–33.0)
MCHC: 33.8 g/dL (ref 31.5–35.7)
MCV: 94 fL (ref 79–97)
Monocytes Absolute: 0.5 10*3/uL (ref 0.1–0.9)
Monocytes: 6 %
Neutrophils Absolute: 5.2 10*3/uL (ref 1.4–7.0)
Neutrophils: 65 %
Platelets: 356 10*3/uL (ref 150–450)
RBC: 4.68 x10E6/uL (ref 3.77–5.28)
RDW: 11.7 % (ref 11.7–15.4)
WBC: 8.1 10*3/uL (ref 3.4–10.8)

## 2019-12-12 LAB — CERVICOVAGINAL ANCILLARY ONLY
Bacterial Vaginitis (gardnerella): NEGATIVE
Candida Glabrata: NEGATIVE
Candida Vaginitis: NEGATIVE
Chlamydia: NEGATIVE
Comment: NEGATIVE
Comment: NEGATIVE
Comment: NEGATIVE
Comment: NEGATIVE
Comment: NEGATIVE
Comment: NORMAL
Neisseria Gonorrhea: NEGATIVE
Trichomonas: NEGATIVE

## 2019-12-12 LAB — INSULIN, RANDOM: INSULIN: 25.1 u[IU]/mL — ABNORMAL HIGH (ref 2.6–24.9)

## 2019-12-16 DIAGNOSIS — Z20822 Contact with and (suspected) exposure to covid-19: Secondary | ICD-10-CM | POA: Diagnosis not present

## 2019-12-31 ENCOUNTER — Telehealth (HOSPITAL_COMMUNITY): Payer: Self-pay | Admitting: Family Medicine

## 2019-12-31 DIAGNOSIS — E161 Other hypoglycemia: Secondary | ICD-10-CM

## 2019-12-31 MED ORDER — METFORMIN HCL 500 MG PO TABS: 500.0000 mg | ORAL_TABLET | Freq: Two times a day (BID) | ORAL | 0 refills | Status: DC

## 2019-12-31 NOTE — Telephone Encounter (Signed)
Metformin 500mg  BID for hyperinsulinemia

## 2020-01-27 ENCOUNTER — Ambulatory Visit (INDEPENDENT_AMBULATORY_CARE_PROVIDER_SITE_OTHER): Payer: BC Managed Care – PPO | Admitting: Adult Health

## 2020-01-27 ENCOUNTER — Encounter: Payer: Self-pay | Admitting: Adult Health

## 2020-01-27 VITALS — BP 118/77 | HR 90 | Ht 63.0 in | Wt 221.0 lb

## 2020-01-27 DIAGNOSIS — N907 Vulvar cyst: Secondary | ICD-10-CM | POA: Insufficient documentation

## 2020-01-27 DIAGNOSIS — R232 Flushing: Secondary | ICD-10-CM | POA: Diagnosis not present

## 2020-01-27 DIAGNOSIS — N941 Unspecified dyspareunia: Secondary | ICD-10-CM | POA: Diagnosis not present

## 2020-01-27 DIAGNOSIS — N898 Other specified noninflammatory disorders of vagina: Secondary | ICD-10-CM | POA: Diagnosis not present

## 2020-01-27 MED ORDER — PREMARIN 0.625 MG/GM VA CREA
TOPICAL_CREAM | VAGINAL | 2 refills | Status: DC
Start: 2020-01-27 — End: 2020-06-04

## 2020-01-27 NOTE — Progress Notes (Signed)
Patient ID: Anne Eaton, female   DOB: April 21, 1986, 34 y.o.   MRN: [id number] History of Present Illness: Lynnda Shields is a 34 year old white female, married, sp hysterectomy in June 2012 for endometriosis. She is complaining of vaginal dryness and pain with sex.And gets cysts on labia. She also has hot flashes and night sweats, irritability and weight gain, she says breast went from DD to DDD, had mammogram and MRI in last year. Was seen at urgent care, and had labs, random insulin level 25.1  and prescribed metformin and estradiol gel. FSH 6.8.  Current Medications, Allergies, Past Medical History, Past Surgical History, Family History and Social History were reviewed in [employer] record.     Review of Systems: Vaginal dryness and pain with sex See HPI for more positives     Physical Exam:BP 118/77 (BP Location: Left Arm, Patient Position: Sitting, Cuff Size: Normal)   Pulse 90   Ht 5\' 3"  (1.6 m)   Wt 221 lb (100.2 kg)   BMI 39.15 kg/m  General:  Well developed, well nourished, no acute distress Skin:  Warm and dry Lungs; Clear to auscultation bilaterally Cardiovascular: Regular rate and rhythm Pelvic:  External genitalia is normal in appearance, has epidermal cyst right labia.  The vagina is pale and dry. Urethra has no lesions or masses. The cervix and uterus are absent.  No adnexal masses or tenderness noted.Bladder is non tender, no masses felt. Psych:  No mood changes, alert and cooperative,seems happy AA is 3 Fall risk is high PHQ 9 score is 9, no SI, declines meds  Examination chaperoned by LPN   Upstream - 01/27/20 1051      Pregnancy Intention Screening   Does the patient want to become pregnant in the next year? No   hysterectomy   Does the patient's partner want to become pregnant in the next year? No    Would the patient like to discuss contraceptive options today? N/A      Contraception Wrap Up   Contraception Counseling  Provided No          Impression and Plan: 1. Vaginal dryness Will add PVC No sex for 2 weeks   Meds ordered this encounter  Medications  . conjugated estrogens (PREMARIN) vaginal cream    Sig: Use 1 gm in vagina daily for 2 weeks then 2-3 x a week    Dispense:  42.5 g    Refill:  2    Order Specific Question:   Supervising Provider    Answer:   01/29/20 H [[id number]]  Follow up in 4 weeks   2. Dyspareunia in female Try astroglide, EVOO or coconut oil   3. Hot flashes Continue estradiol gel  4. Epidermal cyst of vulva Leave alone

## 2020-02-02 ENCOUNTER — Ambulatory Visit: Payer: BC Managed Care – PPO | Admitting: Specialist

## 2020-02-24 ENCOUNTER — Encounter: Payer: Self-pay | Admitting: Adult Health

## 2020-02-24 ENCOUNTER — Ambulatory Visit (INDEPENDENT_AMBULATORY_CARE_PROVIDER_SITE_OTHER): Payer: BC Managed Care – PPO | Admitting: Adult Health

## 2020-02-24 VITALS — BP 100/69 | HR 75 | Ht 62.5 in | Wt 222.5 lb

## 2020-02-24 DIAGNOSIS — E161 Other hypoglycemia: Secondary | ICD-10-CM | POA: Diagnosis not present

## 2020-02-24 DIAGNOSIS — N898 Other specified noninflammatory disorders of vagina: Secondary | ICD-10-CM | POA: Diagnosis not present

## 2020-02-24 DIAGNOSIS — N941 Unspecified dyspareunia: Secondary | ICD-10-CM

## 2020-02-24 MED ORDER — METFORMIN HCL 500 MG PO TABS: 500.0000 mg | ORAL_TABLET | Freq: Two times a day (BID) | ORAL | 1 refills | Status: DC

## 2020-02-24 NOTE — Progress Notes (Signed)
  Subjective:     Patient ID: Janan Bogie Castellana, female   DOB: 12-Jan-1986, 34 y.o.   MRN: [id number]  HPI Cyndi is a 34 year old white female, married, sp hysterectomy back in follow up on using PVC for vaginal dryness and pain with sex, has not had sex but feels better. She is out of metformin and has not gotten PCP, but going to see [facility] she says.   Review of Systems Vaginal dryness better +weight gain Reviewed past medical,surgical, social and family history. Reviewed medications and allergies.     Objective:   Physical Exam BP 100/69 (BP Location: Left Arm, Patient Position: Sitting, Cuff Size: Large)   Pulse 75   Ht 5' 2.5" (1.588 m)   Wt 222 lb 8 oz (100.9 kg)   BMI 40.05 kg/m    Skin warm and dry.Pelvic: external genitalia is normal in appearance,epidermal cyst right labia, vagina:tissues pinker and more moisture,urethra has no lesions or masses noted, cervix and uterus absent, adnexa: no masses or tenderness noted. Bladder is non tender and no masses felt.  Husband was in room during exam and acted as Biomedical engineer.   Upstream - 02/24/20 0853      Pregnancy Intention Screening   Does the patient want to become pregnant in the next year? N/A    Does the patient's partner want to become pregnant in the next year? N/A    Would the patient like to discuss contraceptive options today? N/A      Contraception Wrap Up   Current Method No Method - Other Reason   HYST   End Method No Method - Other Reason   HYST   Contraception Counseling Provided No          Assessment:     1. Hyperinsulinemia Refilled metformin til can see PCP Meds ordered this encounter  Medications  . metFORMIN (GLUCOPHAGE) 500 MG tablet    Sig: Take 1 tablet (500 mg total) by mouth 2 (two) times daily with a meal.    Dispense:  60 tablet    Refill:  1    Order Specific Question:   Supervising Provider    Answer:   Despina Hidden, LUTHER H [[id number]]    2. Vaginal dryness Continue PVC has  refills  3. Dyspareunia in female Use good lubricate with sex     Plan:     Follow up in 3 months

## 2020-03-09 ENCOUNTER — Encounter: Payer: Self-pay | Admitting: Specialist

## 2020-03-09 ENCOUNTER — Ambulatory Visit: Payer: Self-pay

## 2020-03-09 ENCOUNTER — Other Ambulatory Visit: Payer: Self-pay

## 2020-03-09 ENCOUNTER — Ambulatory Visit (INDEPENDENT_AMBULATORY_CARE_PROVIDER_SITE_OTHER): Payer: BC Managed Care – PPO | Admitting: Specialist

## 2020-03-09 VITALS — BP 103/74 | HR 75 | Ht 62.5 in | Wt 222.5 lb

## 2020-03-09 DIAGNOSIS — M545 Low back pain, unspecified: Secondary | ICD-10-CM | POA: Diagnosis not present

## 2020-03-09 DIAGNOSIS — M5136 Other intervertebral disc degeneration, lumbar region: Secondary | ICD-10-CM

## 2020-03-09 MED ORDER — TRAMADOL-ACETAMINOPHEN 37.5-325 MG PO TABS: 1.0000 | ORAL_TABLET | Freq: Four times a day (QID) | ORAL | 0 refills | Status: DC | PRN

## 2020-03-09 MED ORDER — GABAPENTIN 300 MG PO CAPS
300.0000 mg | ORAL_CAPSULE | Freq: Every day | ORAL | 3 refills | Status: AC
Start: 2020-03-09 — End: ?

## 2020-03-09 MED ORDER — DICLOFENAC POTASSIUM 50 MG PO TABS: ORAL_TABLET | ORAL | 3 refills | Status: AC

## 2020-03-09 NOTE — Patient Instructions (Addendum)
Avoid frequent bending and stooping  No lifting greater than 10 lbs. May use ice or moist heat for pain. Weight loss is of benefit. Best medication for lumbar disc disease is arthritis medications like motrin, celebrex and naprosyn. Exercise is important to improve your indurance and does allow people to function better inspite of back pain. Recommend physical therapy to work on strengthening core muscles. Pool walking is a good exercise that is sometimes able to be helpful for burning calories and improving endurance. Fusion is a consideration but generally we recommend conservative management for at least one year before surgery Is considered. Pain due to disc injury and disc degeneration unfortunately is treated with fusion to decrease pain and the amount of pain relief Is 60-70% of that present. Most studies indicate that it is not as successful as surgery done for nerve compression or spinal instability. Gabapentin 300 mg po qhs Diclofenac 50  Daily for one week then BID with meal or snack. Tramadol or ultracet  For more severe pain.

## 2020-03-09 NOTE — Progress Notes (Signed)
Office Visit Note   Patient: Anne Eaton           Date of Birth: Aug 03, 1985           MRN: [id number] Visit Date: 03/09/2020              Requested by: Anne Market, DO [address] [place],  [place] [address] PCP: Patient, No Pcp Per   Assessment & Plan: Visit Diagnoses:  1. Low back pain, unspecified back pain laterality, unspecified chronicity, unspecified whether sciatica present   2. Degenerative disc disease, lumbar     Plan: Avoid frequent bending and stooping  No lifting greater than 10 lbs. May use ice or moist heat for pain. Weight loss is of benefit. Best medication for lumbar disc disease is arthritis medications like motrin, celebrex and naprosyn. Exercise is important to improve your indurance and does allow people to function better inspite of back pain. Recommend physical therapy to work on strengthening core muscles. Pool walking is a good exercise that is sometimes able to be helpful for burning calories and improving endurance. Fusion is a consideration but generally we recommend conservative management for at least one year before surgery Is considered. Pain due to disc injury and disc degeneration unfortunately is treated with fusion to decrease pain and the amount of pain relief Is 60-70% of that present. Most studies indicate that it is not as successful as surgery done for nerve compression or spinal instability.  Follow-Up Instructions: No follow-ups on file.   Orders:  Orders Placed This Encounter  Procedures  . XR Lumbar Spine 2-3 Views   Meds ordered this encounter  Medications  . gabapentin (NEURONTIN) 300 MG capsule    Sig: Take 1 capsule (300 mg total) by mouth at bedtime.    Dispense:  30 capsule    Refill:  3  . diclofenac (CATAFLAM) 50 MG tablet    Sig: Take 1 tablet (50 mg total) by mouth 3 (three) times daily for 7 days, THEN 1 tablet (50 mg total) 2 (two) times daily for 21 days.    Dispense:  60 tablet     Refill:  3  . traMADol-acetaminophen (ULTRACET) 37.5-325 MG tablet    Sig: Take 1 tablet by mouth every 6 (six) hours as needed.    Dispense:  30 tablet    Refill:  0      Procedures: No procedures performed   Clinical Data: No additional findings.   Subjective: Chief Complaint  Patient presents with  . Lower Back - Pain    34 year old female with one year history of back pain and right leg. The pain is into the front of the right thigh, right anterior leg and into the  Right toes. Feels like someone is shoving a rod into the right leg. Her pain is worse with standing and walking. It started in right side, sitting and lying with no pressure on the right side seems to be increasing the pain into the left side. She Eaton tried steroid po through urgent care and seeing Anne Eaton he recommmended ESIs and she and her husband did not wish to try this. The last tme she saw Anne Eaton was in 08/2019 and since she Eaton applied for SSDI and Eaton not been able to get her medical records for disablility application she Is referred here by her primary MD Anne Eaton, though she is now being seen at [facility]. She reports stopping with Anne Eaton  due to response to care for her husband. She had stopped working int 2019 she stopped working due to coworkers, she quit and stayed home to take care of the house. It Eaton been rough the last year. 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    Review of Systems  Constitutional: Positive for appetite change, fatigue and unexpected weight change (some weight gain with back issue). Negative for activity change (can't walk a mile , hard to walk to the maiibox due to pain.), chills, diaphoresis and fever.  HENT: Negative for congestion, dental problem, drooling, ear discharge, ear pain, facial swelling, hearing loss, mouth sores, nosebleeds, postnasal drip, rhinorrhea, sinus pressure, sinus pain, sneezing, sore throat, tinnitus,  trouble swallowing and voice change.   Eyes: Negative.  Negative for photophobia, pain, discharge, redness, itching and visual disturbance.  Respiratory: Positive for shortness of breath (asthma with last son). Negative for apnea, cough, choking, chest tightness, wheezing and stridor.   Cardiovascular: Positive for leg swelling. Negative for chest pain and palpitations.  Gastrointestinal: Negative.  Negative for abdominal distention, abdominal pain, anal bleeding, blood in stool, constipation, diarrhea, nausea, rectal pain and vomiting.  Endocrine: Negative.  Negative for cold intolerance, heat intolerance, polydipsia, polyphagia and polyuria.  Genitourinary: Negative.  Negative for difficulty urinating, dyspareunia, dysuria, enuresis, flank pain and frequency.  Musculoskeletal: Positive for back pain. Negative for arthralgias, gait problem, joint swelling, myalgias, neck pain and neck stiffness.  Skin: Negative.  Negative for color change, pallor, rash and wound.  Allergic/Immunologic: Positive for environmental allergies. Negative for food allergies and immunocompromised state.  Neurological: Positive for dizziness, weakness, light-headedness and numbness. Negative for tremors, seizures, syncope, facial asymmetry, speech difficulty and headaches.  Hematological: Negative.  Negative for adenopathy. Does not bruise/bleed easily (inner and outer thighs both legs.).  Psychiatric/Behavioral: Negative.  Negative for agitation, behavioral problems, confusion, decreased concentration, dysphoric mood, hallucinations, self-injury, sleep disturbance and suicidal ideas. The patient is not nervous/anxious and is not hyperactive.      Objective: Vital Signs: BP 103/74 (BP Location: Left Arm, Patient Position: Sitting)   Pulse 75   Ht 5' 2.5" (1.588 m)   Wt 222 lb 8 oz (100.9 kg)   BMI 40.05 kg/m   Physical Exam  Back Exam   Tenderness  The patient is experiencing tenderness in the  lumbar.      Specialty Comments:  No specialty comments available.  Imaging: No results found.   PMFS History: Patient Active Problem List   Diagnosis Date Noted  . Hot flashes 01/27/2020  . Dyspareunia in female 01/27/2020  . Epidermal cyst of vulva 01/27/2020  . Other fatigue 12/11/2019  . Night sweats 12/11/2019  . Dyspareunia, female 12/11/2019  . Hx of ovarian cyst 12/11/2019  . History of partial hysterectomy 12/11/2019  . Vaginal dryness 12/11/2019  . Alteration consciousness 07/29/2018  . Extrinsic asthma without complication 07/02/2018  . Syncope 07/02/2018  . Left sided numbness and tingling 07/02/2018  . Gastroesophageal reflux disease without esophagitis 07/02/2018   Past Medical History:  Diagnosis Date  . Asthma   . Hypercholesteremia   . Hypoglycemia   . Loss of consciousness (HCC)     Family History  Problem Relation Age of Onset  . Basal cell carcinoma Mother   . Hypertension Father   . Atrial fibrillation Father   . Hyperlipidemia Father   . Colon cancer Maternal Grandmother   . Breast cancer Maternal Grandmother   . Varicose  Veins Maternal Grandmother   . Arthritis Maternal Grandfather   . Diabetes Maternal Grandfather   . Heart disease Maternal Grandfather   . Heart failure Maternal Grandfather   . Heart attack Maternal Grandfather   . Hypertension Maternal Grandfather   . Hyperlipidemia Maternal Grandfather   . Arthritis Paternal Grandmother   . COPD Paternal Grandmother   . Hypertension Paternal Grandmother   . Aortic stenosis Paternal Grandmother   . Stroke Paternal Grandmother   . Cataracts Paternal Grandmother   . Macular degeneration Paternal Grandmother   . Varicose Veins Paternal Grandmother   . Diabetes Paternal Grandfather   . Hypertension Paternal Grandfather   . Heart attack Paternal Grandfather   . Asthma Brother   . ADD / ADHD Son   . Asthma Son   . ADD / ADHD Son   . Asthma Son   . ADD / ADHD Son   . Asthma Son    . Alcohol abuse Maternal Uncle   . Arthritis Maternal Uncle   . Diabetes Maternal Uncle   . Hypertension Maternal Uncle   . Hyperlipidemia Maternal Uncle   . COPD Maternal Aunt   . Sleep apnea Maternal Aunt   . Diabetes Maternal Aunt   . Heart disease Maternal Aunt   . Lung cancer Maternal Uncle   . Prostate cancer Paternal Uncle   . Breast cancer Paternal Aunt   . Obesity Paternal Aunt   . Diabetes Paternal Uncle   . Heart attack Paternal Uncle   . Diabetes Paternal Uncle   . Heart attack Paternal Uncle   . Macular degeneration Paternal Uncle   . Diabetes Paternal Uncle   . Hearing loss Paternal Uncle   . Diabetes Paternal Uncle   . Diabetes Paternal Uncle   . Obesity Paternal Uncle   . Diabetes Paternal Aunt   . Hypertension Maternal Aunt   . Hypertension Maternal Aunt     Past Surgical History:  Procedure Laterality Date  . ABDOMINAL HYSTERECTOMY    . CESAREAN SECTION     x 3  . GALLBLADDER SURGERY    . TONSILLECTOMY    . WISDOM TOOTH EXTRACTION     Social History   Occupational History  . Occupation: Unemployed  Tobacco Use  . Smoking status: Current Some Day Smoker    Packs/day: 0.50    Types: Cigarettes  . Smokeless tobacco: Former Neurosurgeon  . Tobacco comment: 0.5 - 1 ppd  Vaping Use  . Vaping Use: Every day  . Substances: Nicotine, Flavoring  Substance and Sexual Activity  . Alcohol use: Yes    Comment: rarely  . Drug use: Never  . Sexual activity: Yes    Birth control/protection: Surgical    Comment: hyst

## 2020-03-22 ENCOUNTER — Encounter (HOSPITAL_COMMUNITY): Payer: Self-pay | Admitting: Physical Therapy

## 2020-03-22 ENCOUNTER — Ambulatory Visit (HOSPITAL_COMMUNITY): Payer: BC Managed Care – PPO | Attending: Specialist | Admitting: Physical Therapy

## 2020-03-22 ENCOUNTER — Other Ambulatory Visit: Payer: Self-pay

## 2020-03-22 DIAGNOSIS — M545 Low back pain, unspecified: Secondary | ICD-10-CM | POA: Insufficient documentation

## 2020-03-22 DIAGNOSIS — R293 Abnormal posture: Secondary | ICD-10-CM

## 2020-03-22 DIAGNOSIS — G8929 Other chronic pain: Secondary | ICD-10-CM | POA: Insufficient documentation

## 2020-03-22 NOTE — Patient Instructions (Signed)
Access Code: [email or web address] URL: [email or web address] Date: 03/22/2020 Prepared by: Georges Lynch  Exercises Supine Transversus Abdominis Bracing - Hands on Stomach - 2-3 x daily - 7 x weekly - 2 sets - 10 reps - 5 seconds hold Supine Double Knee to Chest - 2-3 x daily - 7 x weekly - 2 sets - 5 reps - 10 seconds hold

## 2020-03-22 NOTE — Therapy (Signed)
[facility] [address] [place], [place], [address] Phone: [phone]   Fax:  [phone]  Physical Therapy Evaluation  Patient Details  Name: Anne Eaton MRN: [id number] Date of Birth: [birth year] Referring Provider (PT): Vira Browns MD    Encounter Date: 03/22/2020   PT End of Session - 03/22/20 1721    Visit Number 1    Number of Visits 8    Date for PT Re-Evaluation 04/20/20    Authorization Type BCBS Comm PPO/ Medicaid UHC secondary (no auth req)    Authorization - Visit Number 1    Authorization - Number of Visits 90    PT Start Time 1630    PT Stop Time 1720    PT Time Calculation (min) 50 min    Activity Tolerance Patient tolerated treatment well    Behavior During Therapy WFL for tasks assessed/performed           Past Medical History:  Diagnosis Date   Asthma    Hypercholesteremia    Hypoglycemia    Loss of consciousness (HCC)     Past Surgical History:  Procedure Laterality Date   ABDOMINAL HYSTERECTOMY     CESAREAN SECTION     x 3   GALLBLADDER SURGERY     TONSILLECTOMY     WISDOM TOOTH EXTRACTION      There were no vitals filed for this visit.    Subjective Assessment - 03/22/20 1639    Subjective Patient presents to physical therapy with complaint of LBP. Patient says she has several issues with her back that have been long standing. Says she saw a Doctor last year who did not do much for her. Says another Doctor wanted to give injections but she was opposed to this. Says she was not able to get appropriate pain medications. Says he pain has gotten worse. Has had therapy before but not for this exact problem.    Currently in Pain? Yes    Pain Score 7     Pain Location Back    Pain Orientation Lower;Posterior    Pain Descriptors / Indicators Throbbing;Sharp    Pain Type Chronic pain    Pain Onset More than a month ago    Pain Frequency Constant    Aggravating Factors  Movement     Pain Relieving Factors "Nothing"    Effect of Pain on Daily Activities Limit              [facility] PT Assessment - 03/22/20 0001      Assessment   Medical Diagnosis Chronic LBP     Referring Provider (PT) Vira Browns MD     Onset Date/Surgical Date --   Chronic   Prior Therapy Yes      Precautions   Precautions --   10lb lifting restriction      Restrictions   Weight Bearing Restrictions No      Balance Screen   Has the patient fallen in the past 6 months Yes    How many times? 12    Has the patient had a decrease in activity level because of a fear of falling?  Yes    Is the patient reluctant to leave their home because of a fear of falling?  Yes      Home Environment   Living Environment Private residence    Living Arrangements Spouse/significant other      Prior Function   Level of Independence Independent    Vocation  Unemployed      Cognition   Overall Cognitive Status Within Functional Limits for tasks assessed      Observation/Other Assessments   Focus on Therapeutic Outcomes (FOTO)  76% limited       Posture/Postural Control   Posture/Postural Control Postural limitations    Postural Limitations Increased lumbar lordosis   at L5-S1     ROM / Strength   AROM / PROM / Strength AROM      AROM   AROM Assessment Site Lumbar    Lumbar Flexion 50% limited    Lumbar Extension 100% limited    pain   Lumbar - Right Side Bend 50% limited    pain   Lumbar - Left Side Bend 25% limited     Lumbar - Right Rotation 25% limited     Lumbar - Left Rotation 25% limited                       Objective measurements completed on examination: See above findings.       [facility] Adult PT Treatment/Exercise - 03/22/20 0001      Exercises   Exercises Lumbar      Lumbar Exercises: Stretches   Double Knee to Chest Stretch 5 reps;10 seconds      Lumbar Exercises: Supine   Ab Set 10 reps;5 seconds                  PT Education - 03/22/20 1641     Education Details on evaluation findings, POC and HEP    Person(s) Educated Patient    Methods Explanation;Handout    Comprehension Verbalized understanding            PT Short Term Goals - 03/22/20 1820      PT SHORT TERM GOAL #1   Title Patient will be independent with initial HEP and self-management strategies to improve functional outcomes    Time 2    Period Weeks    Status New    Target Date 04/06/20             PT Long Term Goals - 03/22/20 1821      PT LONG TERM GOAL #1   Title Patient will improve FOTO score by 5% to indicate improvement in functional outcomes    Time 4    Period Weeks    Status New    Target Date 04/20/20      PT LONG TERM GOAL #2   Title Patient will report at least 50% overall improvement in subjective complaint to indicate improvement in ability to perform ADLs.    Time 4    Period Weeks    Status New    Target Date 04/20/20      PT LONG TERM GOAL #3   Title Patient will improve lumbar AROM by 10% in all restricted planes for improved ability to perform functional mobility tasks and ADLs.    Time 4    Period Weeks    Status New    Target Date 04/20/20                  Plan - 03/22/20 1815    Clinical Impression Statement Patient is a 34 y.o. female who presents to physical therapy with complaint of chronic LBP. Patient demonstrates ROM restriction, decreased perceived functional ability per FOTO score and postural abnormalities which are likely contributing to symptoms of pain and are negatively impacting patient ability to perform ADLs and  functional mobility tasks. Patient will benefit from skilled physical therapy services to address these deficits to reduce pain and improve level of function with ADLs and functional mobility tasks.    Personal Factors and Comorbidities Past/Current Experience;Time since onset of injury/illness/exacerbation    Examination-Activity Limitations Stairs;Carry;Bend;Locomotion Level;Lift;Transfers     Examination-Participation Restrictions Laundry;Community Activity;Cleaning;Yard Work    Conservation officer, historic buildings Stable/Uncomplicated    Optometrist Low    Rehab Potential Fair    PT Frequency 2x / week    PT Duration 4 weeks    PT Treatment/Interventions ADLs/Self Care Home Management;Aquatic Therapy;Biofeedback;Cryotherapy;Ultrasound;Parrafin;Fluidtherapy;Therapeutic activities;Patient/family education;Manual lymph drainage;Energy conservation;Splinting;Spinal Manipulations;Dry needling;Manual techniques;Cognitive remediation;Functional mobility training;Stair training;Scar mobilization;Passive range of motion;Balance training;Neuromuscular re-education;DME Instruction;Gait training;Compression bandaging;Orthotic Fit/Training;Therapeutic exercise;Contrast Bath;Electrical Stimulation;Iontophoresis 4mg /ml Dexamethasone;Moist Heat;Traction;Taping;Vasopneumatic Device;Joint Manipulations;Visual/perceptual remediation/compensation    PT Next Visit Plan Complete FOTO. Assess response to HEP. Progress core strenghening and lumbar mobility as tolerated with emphasis of flexion based stretching/ exercise initially (DKTC, SKTC, pelvic tilts) Manuals as needed for pain and restriciton    PT Home Exercise Plan ab brace, DKTC    Consulted and Agree with Plan of Care Patient           Patient will benefit from skilled therapeutic intervention in order to improve the following deficits and impairments:  Pain, Improper body mechanics, Increased fascial restricitons, Postural dysfunction, Decreased activity tolerance, Impaired perceived functional ability, Decreased range of motion, Decreased endurance, Decreased mobility, Increased muscle spasms  Visit Diagnosis: Chronic low back pain, unspecified back pain laterality, unspecified whether sciatica present  Abnormal posture     Problem List Patient Active Problem List   Diagnosis Date Noted   Hot flashes 01/27/2020    Dyspareunia in female 01/27/2020   Epidermal cyst of vulva 01/27/2020   Other fatigue 12/11/2019   Night sweats 12/11/2019   Dyspareunia, female 12/11/2019   Hx of ovarian cyst 12/11/2019   History of partial hysterectomy 12/11/2019   Vaginal dryness 12/11/2019   Alteration consciousness 07/29/2018   Extrinsic asthma without complication 07/02/2018   Syncope 07/02/2018   Left sided numbness and tingling 07/02/2018   Gastroesophageal reflux disease without esophagitis 07/02/2018    6:24 PM, 03/22/20 13/11/21 PT DPT  Physical Therapist with [place]  [facility]  [phone]   [facility] [address] [place], [place], [place] Phone: [phone]   Fax:  [phone]  Name: Anne Eaton MRN: Daisey Must Date of Birth: May 12, 1986

## 2020-03-27 ENCOUNTER — Other Ambulatory Visit: Payer: Self-pay

## 2020-03-27 ENCOUNTER — Encounter (HOSPITAL_COMMUNITY): Payer: Self-pay | Admitting: Physical Therapy

## 2020-03-27 ENCOUNTER — Ambulatory Visit (HOSPITAL_COMMUNITY): Payer: BC Managed Care – PPO | Admitting: Physical Therapy

## 2020-03-27 DIAGNOSIS — M545 Low back pain, unspecified: Secondary | ICD-10-CM | POA: Diagnosis not present

## 2020-03-27 DIAGNOSIS — R293 Abnormal posture: Secondary | ICD-10-CM | POA: Diagnosis not present

## 2020-03-27 DIAGNOSIS — G8929 Other chronic pain: Secondary | ICD-10-CM | POA: Diagnosis not present

## 2020-03-27 NOTE — Therapy (Signed)
[facility] [address] [place], [place], [address] Phone: [phone]   Fax:  [phone]  Physical Therapy Treatment  Patient Details  Name: Natali Lavallee Nuno MRN: [id number] Date of Birth: 07-Oct-1985 Referring Provider (PT): Vira Browns MD    Encounter Date: 03/27/2020   PT End of Session - 03/27/20 0914    Visit Number 2    Number of Visits 8    Date for PT Re-Evaluation 04/20/20    Authorization Type BCBS Comm PPO/ Medicaid UHC secondary (no auth req)    Authorization - Visit Number 2    Authorization - Number of Visits 90    PT Start Time 0915    PT Stop Time 0955    PT Time Calculation (min) 40 min    Activity Tolerance Patient tolerated treatment well    Behavior During Therapy [facility] for tasks assessed/performed           Past Medical History:  Diagnosis Date  . Asthma   . Hypercholesteremia   . Hypoglycemia   . Loss of consciousness [facility])     Past Surgical History:  Procedure Laterality Date  . ABDOMINAL HYSTERECTOMY    . CESAREAN SECTION     x 3  . GALLBLADDER SURGERY    . TONSILLECTOMY    . WISDOM TOOTH EXTRACTION      There were no vitals filed for this visit.   Subjective Assessment - 03/27/20 0915    Subjective Patient states her arm is very painful today which is a previous injury that needs fixed. Back has not changed any. She has not been doing her exercises much because it is hard for her to use her R arm. Ab sets cause severe stomach pain. She goes the 22nd Monday to her PCP so she will see what she wants her to do.    Pain Score 7     Pain Location Back    Pain Orientation Lower    Pain Descriptors / Indicators Throbbing;Sharp    Pain Type Chronic pain    Pain Onset More than a month ago                             [facility] Adult PT Treatment/Exercise - 03/27/20 0001      Lumbar Exercises: Stretches   Single Knee to Chest Stretch 5 reps;10 seconds    Single Knee to Chest  Stretch Limitations with towel      Lumbar Exercises: Supine   Ab Set 10 reps;5 seconds    AB Set Limitations with exhale    Pelvic Tilt 20 reps    Glut Set 5 reps;5 seconds    Bent Knee Raise 5 reps    Bent Knee Raise Limitations with ab sets, 2 sets bilateral     Bridge 10 reps                  PT Education - 03/27/20 0915    Education Details Patient educated on HEP, exercise mechanics    Person(s) Educated Patient    Methods Explanation;Demonstration    Comprehension Verbalized understanding;Returned demonstration            PT Short Term Goals - 03/22/20 1820      PT SHORT TERM GOAL #1   Title Patient will be independent with initial HEP and self-management strategies to improve functional outcomes    Time 2    Period Weeks  Status New    Target Date 04/06/20             PT Long Term Goals - 03/22/20 1821      PT LONG TERM GOAL #1   Title Patient will improve FOTO score by 5% to indicate improvement in functional outcomes    Time 4    Period Weeks    Status New    Target Date 04/20/20      PT LONG TERM GOAL #2   Title Patient will report at least 50% overall improvement in subjective complaint to indicate improvement in ability to perform ADLs.    Time 4    Period Weeks    Status New    Target Date 04/20/20      PT LONG TERM GOAL #3   Title Patient will improve lumbar AROM by 10% in all restricted planes for improved ability to perform functional mobility tasks and ADLs.    Time 4    Period Weeks    Status New    Target Date 04/20/20                 Plan - 03/27/20 0915    Clinical Impression Statement Patient able to complete TRA activation in supine with tactile and verbal cueing for breathing with increased activation on R side and decreased on L. Patient able to complete SKTC in supine with towel to avoid use of painful R shoulder. Patient tolerates glute sets well without increase in symptoms. Patient has intermittent hamstring  cramping with mini bridges despite cueing for glute activation. Patient tolerates pelvic tilts in supine without increase symptoms. Patient will continue to benefit from skilled physical therapy in order to reduce impairment and improve function.    Personal Factors and Comorbidities Past/Current Experience;Time since onset of injury/illness/exacerbation    Examination-Activity Limitations Stairs;Carry;Bend;Locomotion Level;Lift;Transfers    Examination-Participation Restrictions Laundry;Community Activity;Cleaning;Yard Work    Stability/Clinical Decision Making Stable/Uncomplicated    Rehab Potential Fair    PT Frequency 2x / week    PT Duration 4 weeks    PT Treatment/Interventions ADLs/Self Care Home Management;Aquatic Therapy;Biofeedback;Cryotherapy;Ultrasound;Parrafin;Fluidtherapy;Therapeutic activities;Patient/family education;Manual lymph drainage;Energy conservation;Splinting;Spinal Manipulations;Dry needling;Manual techniques;Cognitive remediation;Functional mobility training;Stair training;Scar mobilization;Passive range of motion;Balance training;Neuromuscular re-education;DME Instruction;Gait training;Compression bandaging;Orthotic Fit/Training;Therapeutic exercise;Contrast Bath;Electrical Stimulation;Iontophoresis 4mg /ml Dexamethasone;Moist Heat;Traction;Taping;Vasopneumatic Device;Joint Manipulations;Visual/perceptual remediation/compensation    PT Next Visit Plan Progress core strenghening and lumbar mobility as tolerated with emphasis of flexion based stretching/ exercise initially (DKTC, SKTC, pelvic tilts) Manuals as needed for pain and restriciton    PT Home Exercise Plan ab brace, DKTC, 11/16 glute sets, pelvic tilts    Consulted and Agree with Plan of Care Patient           Patient will benefit from skilled therapeutic intervention in order to improve the following deficits and impairments:  Pain, Improper body mechanics, Increased fascial restricitons, Postural dysfunction,  Decreased activity tolerance, Impaired perceived functional ability, Decreased range of motion, Decreased endurance, Decreased mobility, Increased muscle spasms  Visit Diagnosis: Chronic low back pain, unspecified back pain laterality, unspecified whether sciatica present  Abnormal posture     Problem List Patient Active Problem List   Diagnosis Date Noted  . Hot flashes 01/27/2020  . Dyspareunia in female 01/27/2020  . Epidermal cyst of vulva 01/27/2020  . Other fatigue 12/11/2019  . Night sweats 12/11/2019  . Dyspareunia, female 12/11/2019  . Hx of ovarian cyst 12/11/2019  . History of partial hysterectomy 12/11/2019  . Vaginal dryness 12/11/2019  . Alteration consciousness 07/29/2018  .  Extrinsic asthma without complication 07/02/2018  . Syncope 07/02/2018  . Left sided numbness and tingling 07/02/2018  . Gastroesophageal reflux disease without esophagitis 07/02/2018    9:57 AM, 03/27/20 Wyman Songster PT, DPT Physical Therapist at [facility]   [facility] [address] [place], [place], [address] Phone: [phone]   Fax:  [phone]  Name: Eldine Rencher Preis MRN: [id number] Date of Birth: 09-Aug-1985

## 2020-03-27 NOTE — Patient Instructions (Signed)
Access Code: [email or web address] URL: [email or web address] Date: 03/27/2020 Prepared by: Greig Castilla Kyllie Pettijohn  Exercises Supine Posterior Pelvic Tilt - 1 x daily - 7 x weekly - 2 sets - 10 reps Hooklying Gluteal Sets - 1 x daily - 7 x weekly - 2 sets - 5 reps - 5 second hold

## 2020-03-29 ENCOUNTER — Telehealth (HOSPITAL_COMMUNITY): Payer: Self-pay

## 2020-03-29 ENCOUNTER — Ambulatory Visit (HOSPITAL_COMMUNITY): Payer: BC Managed Care – PPO

## 2020-03-29 NOTE — Telephone Encounter (Signed)
No show, called and spoke to pt who stated she is on her way to [place] following reports they found her father unconscious.  She stated she called earlier today and cancelled apt for today.  Reminded next apt date and time with contact information given if needs to cancel or reschedule future apts.  Becky Sax, LPTA/CLT; [place] [phone]

## 2020-04-01 ENCOUNTER — Other Ambulatory Visit: Payer: Self-pay | Admitting: Adult Health

## 2020-04-01 DIAGNOSIS — E161 Other hypoglycemia: Secondary | ICD-10-CM

## 2020-04-02 ENCOUNTER — Encounter (HOSPITAL_COMMUNITY): Payer: BC Managed Care – PPO | Admitting: Physical Therapy

## 2020-04-02 DIAGNOSIS — Z6838 Body mass index (BMI) 38.0-38.9, adult: Secondary | ICD-10-CM | POA: Diagnosis not present

## 2020-04-02 DIAGNOSIS — E6609 Other obesity due to excess calories: Secondary | ICD-10-CM | POA: Diagnosis not present

## 2020-04-02 DIAGNOSIS — N62 Hypertrophy of breast: Secondary | ICD-10-CM | POA: Diagnosis not present

## 2020-04-02 DIAGNOSIS — M549 Dorsalgia, unspecified: Secondary | ICD-10-CM | POA: Diagnosis not present

## 2020-04-04 ENCOUNTER — Ambulatory Visit (HOSPITAL_COMMUNITY): Payer: BC Managed Care – PPO | Admitting: Physical Therapy

## 2020-04-04 ENCOUNTER — Encounter (HOSPITAL_COMMUNITY): Payer: Self-pay | Admitting: Physical Therapy

## 2020-04-04 ENCOUNTER — Other Ambulatory Visit: Payer: Self-pay

## 2020-04-04 DIAGNOSIS — G8929 Other chronic pain: Secondary | ICD-10-CM | POA: Diagnosis not present

## 2020-04-04 DIAGNOSIS — R293 Abnormal posture: Secondary | ICD-10-CM

## 2020-04-04 DIAGNOSIS — M545 Low back pain, unspecified: Secondary | ICD-10-CM | POA: Diagnosis not present

## 2020-04-04 NOTE — Therapy (Signed)
[facility] [address] [place], [place], [address] Phone: [phone]   Fax:  [phone]  Physical Therapy Treatment  Patient Details  Name: Anne Eaton MRN: [id number] Date of Birth: 02-24-1986 Referring Provider (PT): Vira Browns MD    Encounter Date: 04/04/2020   PT End of Session - 04/04/20 0928    Visit Number 3    Number of Visits 8    Date for PT Re-Evaluation 04/20/20    Authorization Type [employer]BS Comm PPO/ Medicaid UHC secondary (no auth req)    Authorization - Visit Number 3    Authorization - Number of Visits 90    PT Start Time 0920    PT Stop Time 1000    PT Time Calculation (min) 40 min    Activity Tolerance Patient tolerated treatment well    Behavior During Therapy [facility] for tasks assessed/performed           Past Medical History:  Diagnosis Date  . Asthma   . Hypercholesteremia   . Hypoglycemia   . Loss of consciousness [facility])     Past Surgical History:  Procedure Laterality Date  . ABDOMINAL HYSTERECTOMY    . CESAREAN SECTION     x 3  . GALLBLADDER SURGERY    . TONSILLECTOMY    . WISDOM TOOTH EXTRACTION      There were no vitals filed for this visit.   Subjective Assessment - 04/04/20 0922    Subjective Pt states that her HEP are very painful, she is laid up the next day after completing them .  You all don't understand how bad I hurt".    Pertinent History chronic low back pain    Currently in Pain? Yes    Pain Score 9     Pain Location Back    Pain Orientation Lower    Pain Descriptors / Indicators Aching;Sharp    Pain Type Chronic pain    Pain Onset More than a month ago    Pain Frequency Constant    Aggravating Factors  movement    Pain Relieving Factors nothing                             [facility] Adult PT Treatment/Exercise - 04/04/20 0001      Exercises   Exercises Lumbar      Lumbar Exercises: Stretches   Single Knee to Chest Stretch 5 reps;10 seconds     Lower Trunk Rotation 5 reps    Lower Trunk Rotation Limitations x2      Lumbar Exercises: Seated   Sit to Stand 10 reps      Lumbar Exercises: Supine   Ab Set 10 reps    AB Set Limitations lifting head off table     Glut Set 10 reps    Bent Knee Raise 5 reps;3 seconds    Bridge 15 reps    Other Supine Lumbar Exercises decompression ex 1-5                   PT Education - 04/04/20 0950    Education Details proper body mechanics for bed mobilty    Person(s) Educated Patient    Methods Explanation;Demonstration    Comprehension Need further instruction            PT Short Term Goals - 03/22/20 1820      PT SHORT TERM GOAL #1   Title Patient will  be independent with initial HEP and self-management strategies to improve functional outcomes    Time 2    Period Weeks    Status New    Target Date 04/06/20             PT Long Term Goals - 03/22/20 1821      PT LONG TERM GOAL #1   Title Patient will improve FOTO score by 5% to indicate improvement in functional outcomes    Time 4    Period Weeks    Status New    Target Date 04/20/20      PT LONG TERM GOAL #2   Title Patient will report at least 50% overall improvement in subjective complaint to indicate improvement in ability to perform ADLs.    Time 4    Period Weeks    Status New    Target Date 04/20/20      PT LONG TERM GOAL #3   Title Patient will improve lumbar AROM by 10% in all restricted planes for improved ability to perform functional mobility tasks and ADLs.    Time 4    Period Weeks    Status New    Target Date 04/20/20                 Plan - 04/04/20 0929    Clinical Impression Statement PT voices increased pain with all activity. Pt lies down completing a reverse sit up to lie down which normally places stress on low back, therapist attempted to instruct pt in proper body mechanics however pt states," I lie down what is correct for me, it hurts less this way".  PT bends down to  floor to place her purse on the ground when there is a chair readily avaiable, pt forward bend to pick purse off of ground wthout hesitation at end of session.  Pt voices that she has no transverse abdominal mm and that the other therapist has already found this out,  Therapist has pt raise her head off table which she is able to complete, therapist explains that they may be weak and need activating but they are there and they are imperative if she wants her back pain to decrease.  Pt still states that her's do not activate. .    Personal Factors and Comorbidities Past/Current Experience;Time since onset of injury/illness/exacerbation    Examination-Activity Limitations Stairs;Carry;Bend;Locomotion Level;Lift;Transfers    Examination-Participation Restrictions Laundry;Community Activity;Cleaning;Yard Work    Stability/Clinical Decision Making Stable/Uncomplicated    Rehab Potential Fair    PT Frequency 2x / week    PT Duration 4 weeks    PT Treatment/Interventions ADLs/Self Care Home Management;Aquatic Therapy;Biofeedback;Cryotherapy;Ultrasound;Parrafin;Fluidtherapy;Therapeutic activities;Patient/family education;Manual lymph drainage;Energy conservation;Splinting;Spinal Manipulations;Dry needling;Manual techniques;Cognitive remediation;Functional mobility training;Stair training;Scar mobilization;Passive range of motion;Balance training;Neuromuscular re-education;DME Instruction;Gait training;Compression bandaging;Orthotic Fit/Training;Therapeutic exercise;Contrast Bath;Electrical Stimulation;Iontophoresis 4mg /ml Dexamethasone;Moist Heat;Traction;Taping;Vasopneumatic Device;Joint Manipulations;Visual/perceptual remediation/compensation    PT Next Visit Plan Progress core strenghening and lumbar mobility as tolerated with emphasis of flexion based stretching/ exercise initially (DKTC, SKTC, pelvic tilts) Manuals as needed for pain and restriciton    PT Home Exercise Plan ab brace, DKTC, 11/16 glute sets,  pelvic tilts    Consulted and Agree with Plan of Care Patient           Patient will benefit from skilled therapeutic intervention in order to improve the following deficits and impairments:  Pain, Improper body mechanics, Increased fascial restricitons, Postural dysfunction, Decreased activity tolerance, Impaired perceived functional ability, Decreased range of motion, Decreased endurance, Decreased mobility, Increased  muscle spasms  Visit Diagnosis: Chronic low back pain, unspecified back pain laterality, unspecified whether sciatica present  Abnormal posture     Problem List Patient Active Problem List   Diagnosis Date Noted  . Hot flashes 01/27/2020  . Dyspareunia in female 01/27/2020  . Epidermal cyst of vulva 01/27/2020  . Other fatigue 12/11/2019  . Night sweats 12/11/2019  . Dyspareunia, female 12/11/2019  . Hx of ovarian cyst 12/11/2019  . History of partial hysterectomy 12/11/2019  . Vaginal dryness 12/11/2019  . Alteration consciousness 07/29/2018  . Extrinsic asthma without complication 07/02/2018  . Syncope 07/02/2018  . Left sided numbness and tingling 07/02/2018  . Gastroesophageal reflux disease without esophagitis 07/02/2018    Virgina Organ, PT CLT [phone] 04/04/2020, 10:04 AM  [facility] [address] [place], [place], [address] Phone: [phone]   Fax:  [phone]  Name: Anne Eaton MRN: [id number] Date of Birth: Oct 13, 1985

## 2020-04-10 ENCOUNTER — Other Ambulatory Visit: Payer: Self-pay

## 2020-04-10 ENCOUNTER — Ambulatory Visit (HOSPITAL_COMMUNITY): Payer: BC Managed Care – PPO | Admitting: Physical Therapy

## 2020-04-10 ENCOUNTER — Encounter (HOSPITAL_COMMUNITY): Payer: Self-pay | Admitting: Physical Therapy

## 2020-04-10 ENCOUNTER — Telehealth: Payer: Self-pay

## 2020-04-10 DIAGNOSIS — G8929 Other chronic pain: Secondary | ICD-10-CM | POA: Diagnosis not present

## 2020-04-10 DIAGNOSIS — R293 Abnormal posture: Secondary | ICD-10-CM | POA: Diagnosis not present

## 2020-04-10 DIAGNOSIS — M545 Low back pain, unspecified: Secondary | ICD-10-CM | POA: Diagnosis not present

## 2020-04-10 NOTE — Therapy (Signed)
[facility] [address] [place], [place], [address] Phone: [phone]   Fax:  [phone]  Physical Therapy Treatment  Patient Details  Name: Anne Eaton MRN: [id number] Date of Birth: 1985/10/13 Referring Provider (PT): Vira Browns MD    Encounter Date: 04/10/2020   PT End of Session - 04/10/20 0820    Visit Number 4    Number of Visits 8    Date for PT Re-Evaluation 04/20/20    Authorization Type [employer]BS Comm PPO/ Medicaid UHC secondary (no auth req)    Authorization - Visit Number 4    Authorization - Number of Visits 90    PT Start Time [phone]    PT Stop Time 0858    PT Time Calculation (min) 42 min    Activity Tolerance Patient tolerated treatment well    Behavior During Therapy [facility] for tasks assessed/performed           Past Medical History:  Diagnosis Date  . Asthma   . Hypercholesteremia   . Hypoglycemia   . Loss of consciousness [facility])     Past Surgical History:  Procedure Laterality Date  . ABDOMINAL HYSTERECTOMY    . CESAREAN SECTION     x 3  . GALLBLADDER SURGERY    . TONSILLECTOMY    . WISDOM TOOTH EXTRACTION      There were no vitals filed for this visit.   Subjective Assessment - 04/10/20 0819    Subjective Patient says she is having a better day today. Says she has good days and bad days, still not sure what causes this. Says she has been doing HEP as tolerated by pain.    Pertinent History chronic low back pain    Currently in Pain? Yes    Pain Score 5     Pain Location Back    Pain Orientation Lower    Pain Descriptors / Indicators Aching;Sharp    Pain Type Chronic pain    Pain Onset More than a month ago    Pain Frequency Constant                             [facility] Adult PT Treatment/Exercise - 04/10/20 0001      Lumbar Exercises: Stretches   Single Knee to Chest Stretch 5 reps;10 seconds    Single Knee to Chest Stretch Limitations with towel    Lower Trunk Rotation  --      Lumbar Exercises: Supine   Ab Set 10 reps    Glut Set 10 reps    Bent Knee Raise 20 reps    Bridge 10 reps;5 seconds    Straight Leg Raise 10 reps    Other Supine Lumbar Exercises diaphragm breathing x 10                     PT Short Term Goals - 03/22/20 1820      PT SHORT TERM GOAL #1   Title Patient will be independent with initial HEP and self-management strategies to improve functional outcomes    Time 2    Period Weeks    Status New    Target Date 04/06/20             PT Long Term Goals - 03/22/20 1821      PT LONG TERM GOAL #1   Title Patient will improve FOTO score by 5% to indicate improvement in functional  outcomes    Time 4    Period Weeks    Status New    Target Date 04/20/20      PT LONG TERM GOAL #2   Title Patient will report at least 50% overall improvement in subjective complaint to indicate improvement in ability to perform ADLs.    Time 4    Period Weeks    Status New    Target Date 04/20/20      PT LONG TERM GOAL #3   Title Patient will improve lumbar AROM by 10% in all restricted planes for improved ability to perform functional mobility tasks and ADLs.    Time 4    Period Weeks    Status New    Target Date 04/20/20                 Plan - 04/10/20 0858    Clinical Impression Statement Patient tolerated session well overall today. Patient cued to perform activity through pain free/ comfortable range. Patient cued on TA activation with ab bracing with appropriate breathing technique. Patient educated on proper form and function of added diaphragm breathing. Added SLR for core strength progression, patient had increased difficulty on RT>LT. Patient will continue to benefit from skilled therapy services to progress core strength and lumbar mobility to reduce pain and improve LOF with ADLs.    Personal Factors and Comorbidities Past/Current Experience;Time since onset of injury/illness/exacerbation    Examination-Activity  Limitations Stairs;Carry;Bend;Locomotion Level;Lift;Transfers    Examination-Participation Restrictions Laundry;Community Activity;Cleaning;Yard Work    Stability/Clinical Decision Making Stable/Uncomplicated    Rehab Potential Fair    PT Frequency 2x / week    PT Duration 4 weeks    PT Treatment/Interventions ADLs/Self Care Home Management;Aquatic Therapy;Biofeedback;Cryotherapy;Ultrasound;Parrafin;Fluidtherapy;Therapeutic activities;Patient/family education;Manual lymph drainage;Energy conservation;Splinting;Spinal Manipulations;Dry needling;Manual techniques;Cognitive remediation;Functional mobility training;Stair training;Scar mobilization;Passive range of motion;Balance training;Neuromuscular re-education;DME Instruction;Gait training;Compression bandaging;Orthotic Fit/Training;Therapeutic exercise;Contrast Bath;Electrical Stimulation;Iontophoresis 4mg /ml Dexamethasone;Moist Heat;Traction;Taping;Vasopneumatic Device;Joint Manipulations;Visual/perceptual remediation/compensation    PT Next Visit Plan Progress core strenghening and lumbar mobility as tolerated with emphasis of flexion based stretching/ exercise initially (DKTC, SKTC, pelvic tilts) Manuals as needed for pain and restriciton    PT Home Exercise Plan ab brace, DKTC, 11/16 glute sets, pelvic tilts    Consulted and Agree with Plan of Care Patient           Patient will benefit from skilled therapeutic intervention in order to improve the following deficits and impairments:  Pain, Improper body mechanics, Increased fascial restricitons, Postural dysfunction, Decreased activity tolerance, Impaired perceived functional ability, Decreased range of motion, Decreased endurance, Decreased mobility, Increased muscle spasms  Visit Diagnosis: Chronic low back pain, unspecified back pain laterality, unspecified whether sciatica present  Abnormal posture     Problem List Patient Active Problem List   Diagnosis Date Noted  . Hot flashes  01/27/2020  . Dyspareunia in female 01/27/2020  . Epidermal cyst of vulva 01/27/2020  . Other fatigue 12/11/2019  . Night sweats 12/11/2019  . Dyspareunia, female 12/11/2019  . Hx of ovarian cyst 12/11/2019  . History of partial hysterectomy 12/11/2019  . Vaginal dryness 12/11/2019  . Alteration consciousness 07/29/2018  . Extrinsic asthma without complication 07/02/2018  . Syncope 07/02/2018  . Left sided numbness and tingling 07/02/2018  . Gastroesophageal reflux disease without esophagitis 07/02/2018    9:02 AM, 04/10/20 04/12/20 PT DPT  Physical Therapist with [place]  [facility]  [phone]   [place] [facility] [address] [place], [place],  [address] Phone: [phone]   Fax:  [phone]  Name: Anne Eaton MRN: [id number] Date of Birth: May 02, 1986

## 2020-04-10 NOTE — Telephone Encounter (Signed)
I called and she states that she just got the letter oer the weekend, she states that the letter was supposed to be there yesterday, and she states that she is supposed to call on Friday to see if she needs to report there. I advised that we will look at her case and try to get the letter as soon as we can.  I Advised that I will fax it if he approves it.

## 2020-04-10 NOTE — Telephone Encounter (Signed)
Patient called she stated she has paperwork for jury about her sitting  in a chair or standing or walking for a long period of time. She would like a call back:[phone]

## 2020-04-11 ENCOUNTER — Other Ambulatory Visit: Payer: Self-pay | Admitting: Specialist

## 2020-04-11 NOTE — Telephone Encounter (Signed)
Note done, pt is aware it will be at the front desk to pick up.

## 2020-04-12 ENCOUNTER — Encounter (HOSPITAL_COMMUNITY): Payer: Self-pay | Admitting: Physical Therapy

## 2020-04-12 ENCOUNTER — Ambulatory Visit (HOSPITAL_COMMUNITY): Payer: BC Managed Care – PPO | Attending: Specialist | Admitting: Physical Therapy

## 2020-04-12 ENCOUNTER — Other Ambulatory Visit: Payer: Self-pay

## 2020-04-12 DIAGNOSIS — M545 Low back pain, unspecified: Secondary | ICD-10-CM | POA: Diagnosis not present

## 2020-04-12 DIAGNOSIS — R293 Abnormal posture: Secondary | ICD-10-CM | POA: Insufficient documentation

## 2020-04-12 DIAGNOSIS — G8929 Other chronic pain: Secondary | ICD-10-CM | POA: Insufficient documentation

## 2020-04-12 NOTE — Therapy (Signed)
[facility] [address] [place], [place], [address] Phone: [phone]   Fax:  [phone]  Physical Therapy Treatment  Patient Details  Name: Anne Eaton MRN: [id number] Date of Birth: [birth year] Referring Provider (PT): Vira Browns MD    Encounter Date: 04/12/2020   PT End of Session - 04/12/20 0821    Visit Number 5    Number of Visits 8    Date for PT Re-Evaluation 04/20/20    Authorization Type BCBS Comm PPO/ Medicaid UHC secondary (no auth req)    Authorization - Visit Number 5    Authorization - Number of Visits 90    PT Start Time [phone]    PT Stop Time 0857    PT Time Calculation (min) 40 min    Activity Tolerance Patient tolerated treatment well    Behavior During Therapy [facility] for tasks assessed/performed           Past Medical History:  Diagnosis Date   Asthma    Hypercholesteremia    Hypoglycemia    Loss of consciousness (HCC)     Past Surgical History:  Procedure Laterality Date   ABDOMINAL HYSTERECTOMY     CESAREAN SECTION     x 3   GALLBLADDER SURGERY     TONSILLECTOMY     WISDOM TOOTH EXTRACTION      There were no vitals filed for this visit.   Subjective Assessment - 04/12/20 0820    Subjective Pateitn says she did not sleep well last night. Says she doesnt feel good. Feels unwell. Says her back only hurt once since last visit, can feel her hips grinding.    Pertinent History chronic low back pain    Currently in Pain? Yes    Pain Score 5     Pain Location Back    Pain Orientation Lower    Pain Descriptors / Indicators Aching;Sharp    Pain Type Chronic pain    Pain Onset More than a month ago    Pain Frequency Constant                             [facility] Adult PT Treatment/Exercise - 04/12/20 0001      Lumbar Exercises: Stretches   Double Knee to Chest Stretch 5 reps;10 seconds    Double Knee to Chest Stretch Limitations improved discomfort with bent knee  raise     Lower Trunk Rotation 5 reps;10 seconds      Lumbar Exercises: Supine   Ab Set 10 reps    Bent Knee Raise 20 reps    Bridge 10 reps;5 seconds    Straight Leg Raise 10 reps    Straight Leg Raises Limitations with ab set     Other Supine Lumbar Exercises diaphragm breathing x 10     Other Supine Lumbar Exercises iso hip abd with belt 10 x 5"                    PT Short Term Goals - 03/22/20 1820      PT SHORT TERM GOAL #1   Title Patient will be independent with initial HEP and self-management strategies to improve functional outcomes    Time 2    Period Weeks    Status New    Target Date 04/06/20             PT Long Term Goals - 03/22/20 1821  PT LONG TERM GOAL #1   Title Patient will improve FOTO score by 5% to indicate improvement in functional outcomes    Time 4    Period Weeks    Status New    Target Date 04/20/20      PT LONG TERM GOAL #2   Title Patient will report at least 50% overall improvement in subjective complaint to indicate improvement in ability to perform ADLs.    Time 4    Period Weeks    Status New    Target Date 04/20/20      PT LONG TERM GOAL #3   Title Patient will improve lumbar AROM by 10% in all restricted planes for improved ability to perform functional mobility tasks and ADLs.    Time 4    Period Weeks    Status New    Target Date 04/20/20                 Plan - 04/12/20 0857    Clinical Impression Statement Patient somewhat pain limited with exercise today. Held glute set per patient request. Patient describes increased rolling pressure about RT posterior hip with bent knee raises. Performed DKTC stretch for lumbar flexion. Discussed purpose of this exercise and spine anatomy in relation to previous xray findings in patient chart. Patient noted decreased discomfort in RT hip with bent knee raises after performing DKTC stretch. Added supine isometric hip abduction for glute strength progression. Patient  educated on proper form and function. Patient will continue to benefit from skilled therapy services to progress hip and core strength to reduce pain and improve LOF with ADLs.    Personal Factors and Comorbidities Past/Current Experience;Time since onset of injury/illness/exacerbation    Examination-Activity Limitations Stairs;Carry;Bend;Locomotion Level;Lift;Transfers    Examination-Participation Restrictions Laundry;Community Activity;Cleaning;Yard Work    Stability/Clinical Decision Making Stable/Uncomplicated    Rehab Potential Fair    PT Frequency 2x / week    PT Duration 4 weeks    PT Treatment/Interventions ADLs/Self Care Home Management;Aquatic Therapy;Biofeedback;Cryotherapy;Ultrasound;Parrafin;Fluidtherapy;Therapeutic activities;Patient/family education;Manual lymph drainage;Energy conservation;Splinting;Spinal Manipulations;Dry needling;Manual techniques;Cognitive remediation;Functional mobility training;Stair training;Scar mobilization;Passive range of motion;Balance training;Neuromuscular re-education;DME Instruction;Gait training;Compression bandaging;Orthotic Fit/Training;Therapeutic exercise;Contrast Bath;Electrical Stimulation;Iontophoresis 4mg /ml Dexamethasone;Moist Heat;Traction;Taping;Vasopneumatic Device;Joint Manipulations;Visual/perceptual remediation/compensation    PT Next Visit Plan Progress core strenghening and lumbar mobility as tolerated with emphasis of flexion based stretching/ exercise initially (DKTC, SKTC, pelvic tilts) Manuals as needed for pain and restriciton    PT Home Exercise Plan ab brace, DKTC, 11/16 glute sets, pelvic tilts    Consulted and Agree with Plan of Care Patient           Patient will benefit from skilled therapeutic intervention in order to improve the following deficits and impairments:  Pain, Improper body mechanics, Increased fascial restricitons, Postural dysfunction, Decreased activity tolerance, Impaired perceived functional ability,  Decreased range of motion, Decreased endurance, Decreased mobility, Increased muscle spasms  Visit Diagnosis: Chronic low back pain, unspecified back pain laterality, unspecified whether sciatica present  Abnormal posture     Problem List Patient Active Problem List   Diagnosis Date Noted   Hot flashes 01/27/2020   Dyspareunia in female 01/27/2020   Epidermal cyst of vulva 01/27/2020   Other fatigue 12/11/2019   Night sweats 12/11/2019   Dyspareunia, female 12/11/2019   Hx of ovarian cyst 12/11/2019   History of partial hysterectomy 12/11/2019   Vaginal dryness 12/11/2019   Alteration consciousness 07/29/2018   Extrinsic asthma without complication 07/02/2018   Syncope 07/02/2018   Left sided numbness and tingling  07/02/2018   Gastroesophageal reflux disease without esophagitis 07/02/2018    8:58 AM, 04/12/20 Georges Lynch PT DPT  Physical Therapist with [place]  [facility]  [phone]   [facility] [address] [place], [place], [address] Phone: [phone]   Fax:  [phone]  Name: Charnae Lill [place] MRN: [id number] Date of Birth: 1985/12/06

## 2020-04-17 ENCOUNTER — Ambulatory Visit (HOSPITAL_COMMUNITY): Payer: BC Managed Care – PPO | Admitting: Physical Therapy

## 2020-04-17 ENCOUNTER — Other Ambulatory Visit: Payer: Self-pay

## 2020-04-17 ENCOUNTER — Encounter (HOSPITAL_COMMUNITY): Payer: Self-pay | Admitting: Physical Therapy

## 2020-04-17 DIAGNOSIS — G8929 Other chronic pain: Secondary | ICD-10-CM

## 2020-04-17 DIAGNOSIS — M545 Low back pain, unspecified: Secondary | ICD-10-CM

## 2020-04-17 DIAGNOSIS — R293 Abnormal posture: Secondary | ICD-10-CM

## 2020-04-17 DIAGNOSIS — M549 Dorsalgia, unspecified: Secondary | ICD-10-CM | POA: Diagnosis not present

## 2020-04-17 DIAGNOSIS — N62 Hypertrophy of breast: Secondary | ICD-10-CM | POA: Diagnosis not present

## 2020-04-17 NOTE — Therapy (Signed)
[facility] [address] [place], [place], [address] Phone: [phone]   Fax:  [phone]  Physical Therapy Treatment  Patient Details  Name: Anne Eaton MRN: [id number] Date of Birth: [birth year] Referring Provider (PT): Vira Browns MD    Encounter Date: 04/17/2020   PT End of Session - 04/17/20 0832    Visit Number 6    Number of Visits 8    Date for PT Re-Evaluation 04/20/20    Authorization Type BCBS Comm PPO/ Medicaid UHC secondary (no auth req)    Authorization - Visit Number 6    Authorization - Number of Visits 90    PT Start Time 0820    PT Stop Time 0900    PT Time Calculation (min) 40 min    Activity Tolerance Patient tolerated treatment well    Behavior During Therapy [facility] for tasks assessed/performed           Past Medical History:  Diagnosis Date  . Asthma   . Hypercholesteremia   . Hypoglycemia   . Loss of consciousness [facility])     Past Surgical History:  Procedure Laterality Date  . ABDOMINAL HYSTERECTOMY    . CESAREAN SECTION     x 3  . GALLBLADDER SURGERY    . TONSILLECTOMY    . WISDOM TOOTH EXTRACTION      There were no vitals filed for this visit.   Subjective Assessment - 04/17/20 0827    Subjective Patient says her mid back was giving her problems the past few days. Says her RT hip is still hurting. Says she was doing some painting at her house.    Pertinent History chronic low back pain    Currently in Pain? Yes    Pain Score 5     Pain Location Back    Pain Orientation Mid;Posterior    Pain Descriptors / Indicators Aching    Pain Type Chronic pain    Pain Onset More than a month ago    Pain Frequency Constant    Aggravating Factors  movement    Pain Relieving Factors nothing    Effect of Pain on Daily Activities Limits    Multiple Pain Sites Yes    Pain Score 5    Pain Location Hip    Pain Orientation Right;Posterior    Pain Descriptors / Indicators Burning;Sharp    Pain Type  Chronic pain    Pain Onset More than a month ago    Pain Frequency Constant    Aggravating Factors  movement    Pain Relieving Factors rest    Effect of Pain on Daily Activities Limits                             [facility] Adult PT Treatment/Exercise - 04/17/20 0001      Lumbar Exercises: Stretches   Single Knee to Chest Stretch 5 reps;10 seconds    Double Knee to Chest Stretch 5 reps;10 seconds      Lumbar Exercises: Standing   Row Both;20 reps;Theraband    Theraband Level (Row) Level 2 (Red)    Shoulder Extension Both;10 reps;Theraband    Theraband Level (Shoulder Extension) Level 2 (Red)    Other Standing Lumbar Exercises --      Lumbar Exercises: Supine   Bridge 10 reps;5 seconds    Straight Leg Raise 10 reps    Straight Leg Raises Limitations with ab set  Other Supine Lumbar Exercises --                    PT Short Term Goals - 03/22/20 1820      PT SHORT TERM GOAL #1   Title Patient will be independent with initial HEP and self-management strategies to improve functional outcomes    Time 2    Period Weeks    Status New    Target Date 04/06/20             PT Long Term Goals - 03/22/20 1821      PT LONG TERM GOAL #1   Title Patient will improve FOTO score by 5% to indicate improvement in functional outcomes    Time 4    Period Weeks    Status New    Target Date 04/20/20      PT LONG TERM GOAL #2   Title Patient will report at least 50% overall improvement in subjective complaint to indicate improvement in ability to perform ADLs.    Time 4    Period Weeks    Status New    Target Date 04/20/20      PT LONG TERM GOAL #3   Title Patient will improve lumbar AROM by 10% in all restricted planes for improved ability to perform functional mobility tasks and ADLs.    Time 4    Period Weeks    Status New    Target Date 04/20/20                 Plan - 04/17/20 0933    Clinical Impression Statement Patient showed good  tolerance with mat exercise today. Discussed possible progressions for hip and core strengthening when ready. Discussed adding supine scapular retraction/ decompression to address complaint of mid back pain. Patient says she has tried this at home and it hurts too much. Discussed addition of band resistance to try in upright position and patient agrees. Patient did well with band rows but noted RT arm pain with shoulder extension. Cued patient to modify hand position but pain persisted. Held activity. Discussed continuation of HEP for hip and back to tolerance. Patient agrees. Patient will continue to benefit from skilled therapy services to progress hip and core strength to reduce pain and improve LOF with ADLs.    Personal Factors and Comorbidities Past/Current Experience;Time since onset of injury/illness/exacerbation    Examination-Activity Limitations Stairs;Carry;Bend;Locomotion Level;Lift;Transfers    Examination-Participation Restrictions Laundry;Community Activity;Cleaning;Yard Work    Stability/Clinical Decision Making Stable/Uncomplicated    Rehab Potential Fair    PT Frequency 2x / week    PT Duration 4 weeks    PT Treatment/Interventions ADLs/Self Care Home Management;Aquatic Therapy;Biofeedback;Cryotherapy;Ultrasound;Parrafin;Fluidtherapy;Therapeutic activities;Patient/family education;Manual lymph drainage;Energy conservation;Splinting;Spinal Manipulations;Dry needling;Manual techniques;Cognitive remediation;Functional mobility training;Stair training;Scar mobilization;Passive range of motion;Balance training;Neuromuscular re-education;DME Instruction;Gait training;Compression bandaging;Orthotic Fit/Training;Therapeutic exercise;Contrast Bath;Electrical Stimulation;Iontophoresis 4mg /ml Dexamethasone;Moist Heat;Traction;Taping;Vasopneumatic Device;Joint Manipulations;Visual/perceptual remediation/compensation    PT Next Visit Plan Reassess next visit per end of cert. Adjust POC as indicated     PT Home Exercise Plan ab brace, DKTC, 11/16 glute sets, pelvic tilts    Consulted and Agree with Plan of Care Patient           Patient will benefit from skilled therapeutic intervention in order to improve the following deficits and impairments:  Pain, Improper body mechanics, Increased fascial restricitons, Postural dysfunction, Decreased activity tolerance, Impaired perceived functional ability, Decreased range of motion, Decreased endurance, Decreased mobility, Increased muscle spasms  Visit Diagnosis: Chronic low back pain,  unspecified back pain laterality, unspecified whether sciatica present  Abnormal posture     Problem List Patient Active Problem List   Diagnosis Date Noted  . Hot flashes 01/27/2020  . Dyspareunia in female 01/27/2020  . Epidermal cyst of vulva 01/27/2020  . Other fatigue 12/11/2019  . Night sweats 12/11/2019  . Dyspareunia, female 12/11/2019  . Hx of ovarian cyst 12/11/2019  . History of partial hysterectomy 12/11/2019  . Vaginal dryness 12/11/2019  . Alteration consciousness 07/29/2018  . Extrinsic asthma without complication 07/02/2018  . Syncope 07/02/2018  . Left sided numbness and tingling 07/02/2018  . Gastroesophageal reflux disease without esophagitis 07/02/2018   9:35 AM, 04/17/20 Georges Lynch PT DPT  Physical Therapist with [place]  [facility]  [phone]   [facility] [address] [place], [place], [address] Phone: [phone]   Fax:  [phone]  Name: Alysiah Suppa Dalal MRN: [id number] Date of Birth: 09-Sep-1985

## 2020-04-19 ENCOUNTER — Ambulatory Visit (HOSPITAL_COMMUNITY): Payer: BC Managed Care – PPO | Admitting: Physical Therapy

## 2020-04-19 ENCOUNTER — Encounter (HOSPITAL_COMMUNITY): Payer: Self-pay | Admitting: Physical Therapy

## 2020-04-19 ENCOUNTER — Other Ambulatory Visit: Payer: Self-pay

## 2020-04-19 DIAGNOSIS — G8929 Other chronic pain: Secondary | ICD-10-CM | POA: Diagnosis not present

## 2020-04-19 DIAGNOSIS — R293 Abnormal posture: Secondary | ICD-10-CM | POA: Diagnosis not present

## 2020-04-19 DIAGNOSIS — M545 Low back pain, unspecified: Secondary | ICD-10-CM | POA: Diagnosis not present

## 2020-04-19 NOTE — Therapy (Signed)
[facility] [address] [place], [place], [address] Phone: [phone]   Fax:  [phone]  Physical Therapy Treatment  Patient Details  Name: Anne Eaton MRN: [id number] Date of Birth: 12-01-1985 Referring Provider (PT): Basil Dess MD   PHYSICAL THERAPY DISCHARGE SUMMARY  Visits from Start of Care: 7  Current functional level related to goals / functional outcomes: See below    Remaining deficits: See below    Education / Equipment: See assessment  Plan: Patient agrees to discharge.  Patient goals were met. Patient is being discharged due to meeting the stated rehab goals.  ?????       Encounter Date: 04/19/2020   PT End of Session - 04/19/20 0831    Visit Number 7    Number of Visits 8    Date for PT Re-Evaluation 04/20/20    Authorization Type BCBS Comm PPO/ Medicaid UHC secondary (no auth req)    Authorization - Visit Number 7    Authorization - Number of Visits 90    PT Start Time [phone]    PT Stop Time 0905    PT Time Calculation (min) 48 min    Activity Tolerance Patient tolerated treatment well    Behavior During Therapy [facility] for tasks assessed/performed           Past Medical History:  Diagnosis Date  . Asthma   . Hypercholesteremia   . Hypoglycemia   . Loss of consciousness [facility])     Past Surgical History:  Procedure Laterality Date  . ABDOMINAL HYSTERECTOMY    . CESAREAN SECTION     x 3  . GALLBLADDER SURGERY    . TONSILLECTOMY    . WISDOM TOOTH EXTRACTION      There were no vitals filed for this visit.   Subjective Assessment - 04/19/20 0828    Subjective Patient says she feels that things are a little better. Pain is not as bad. Still feels that she has good days and bad days. Good weeks and bad weeks. She is apprehensive day to day whether she will be in pain. Patient reports 50-60% improvement overall since starting therapy.    Pertinent History chronic low back pain    Currently in  Pain? Yes    Pain Score 4     Pain Location Back    Pain Orientation Mid;Posterior    Pain Descriptors / Indicators Aching    Pain Type Chronic pain    Pain Onset More than a month ago    Pain Frequency Constant    Aggravating Factors  standing, doing dishes, leaning forward    Pain Relieving Factors sitting down, resting    Effect of Pain on Daily Activities Limits    Pain Score 8    Pain Location Hip    Pain Orientation Right;Posterior    Pain Descriptors / Indicators Burning;Sharp    Pain Type Chronic pain    Pain Onset More than a month ago    Pain Frequency Constant    Aggravating Factors  "everything"    Pain Relieving Factors "nothing"    Effect of Pain on Daily Activities Limits              [facility] PT Assessment - 04/19/20 0001      Assessment   Medical Diagnosis Chronic LBP     Referring Provider (PT) Basil Dess MD     Prior Therapy Yes      Restrictions   Weight Bearing  Restrictions No      Balance Screen   Has the patient fallen in the past 6 months No      [place] residence    Living Arrangements Spouse/significant other      Prior Function   Level of Independence Independent      Cognition   Overall Cognitive Status Within Functional Limits for tasks assessed      Observation/Other Assessments   Focus on Therapeutic Outcomes (FOTO)  64% limited   was 76% limited     AROM   Lumbar Flexion 25% limited   was 50% limited   Lumbar Extension 50% limited   was 100% limited   Lumbar - Right Side Bend WNL   was 50% limited   Lumbar - Left Side Bend WNL   was 25% limited   Lumbar - Right Rotation WNL   25% limited   Lumbar - Left Rotation WNL   was 25% limited                                  PT Short Term Goals - 04/19/20 0849      PT SHORT TERM GOAL #1   Title Patient will be independent with initial HEP and self-management strategies to improve functional outcomes    Baseline  Reports and demos compliance    Time 2    Period Weeks    Status Achieved    Target Date 04/06/20             PT Long Term Goals - 04/19/20 0849      PT LONG TERM GOAL #1   Title Patient will improve FOTO score by 5% to indicate improvement in functional outcomes    Baseline Improved by 12%    Time 4    Period Weeks    Status Achieved      PT LONG TERM GOAL #2   Title Patient will report at least 50% overall improvement in subjective complaint to indicate improvement in ability to perform ADLs.    Baseline Reports 50-60% improvement    Time 4    Period Weeks    Status Achieved      PT LONG TERM GOAL #3   Title Patient will improve lumbar AROM by 10% in all restricted planes for improved ability to perform functional mobility tasks and ADLs.    Baseline See AROM    Time 4    Period Weeks    Status Achieved                 Plan - 04/19/20 1413    Clinical Impression Statement Patient has progressed well toward therapy goals and has currently met all long tern goals. Patient continues to be somewhat pain limited with activity, and reports a general globalization of pain involving more than 4 body areas. Patient does report overall decrease in lumbar pain, and improvement with functional ability, as indicated by FOTO scores. At this time, patient will be DC to independent with HEP to continue work on core strength and lumbar stabilization for pain reduction. Patient with all functional goals met. Patient educated on and issued updated HEP handout and instructed to follow up with therapy services with any further questions or concerns.    Personal Factors and Comorbidities Past/Current Experience;Time since onset of injury/illness/exacerbation    Examination-Activity Limitations Stairs;Carry;Bend;Locomotion Level;Lift;Transfers    Examination-Participation Restrictions Laundry;Community  Activity;Cleaning;Yard Work    Stability/Clinical Decision Making Stable/Uncomplicated     Rehab Potential Fair    PT Frequency 2x / week    PT Duration 4 weeks    PT Treatment/Interventions ADLs/Self Care Home Management;Aquatic Therapy;Biofeedback;Cryotherapy;Ultrasound;Parrafin;Fluidtherapy;Therapeutic activities;Patient/family education;Manual lymph drainage;Energy conservation;Splinting;Spinal Manipulations;Dry needling;Manual techniques;Cognitive remediation;Functional mobility training;Stair training;Scar mobilization;Passive range of motion;Balance training;Neuromuscular re-education;DME Instruction;Gait training;Compression bandaging;Orthotic Fit/Training;Therapeutic exercise;Contrast Bath;Electrical Stimulation;Iontophoresis 34m/ml Dexamethasone;Moist Heat;Traction;Taping;Vasopneumatic Device;Joint Manipulations;Visual/perceptual remediation/compensation    PT Next Visit Plan DC to HEP    PT Home Exercise Plan ab brace, DKTC, 11/16 glute sets, pelvic tilts    Consulted and Agree with Plan of Care Patient           Patient will benefit from skilled therapeutic intervention in order to improve the following deficits and impairments:  Pain,Improper body mechanics,Increased fascial restricitons,Postural dysfunction,Decreased activity tolerance,Impaired perceived functional ability,Decreased range of motion,Decreased endurance,Decreased mobility,Increased muscle spasms  Visit Diagnosis: Chronic low back pain, unspecified back pain laterality, unspecified whether sciatica present  Abnormal posture     Problem List Patient Active Problem List   Diagnosis Date Noted  . Hot flashes 01/27/2020  . Dyspareunia in female 01/27/2020  . Epidermal cyst of vulva 01/27/2020  . Other fatigue 12/11/2019  . Night sweats 12/11/2019  . Dyspareunia, female 12/11/2019  . Hx of ovarian cyst 12/11/2019  . History of partial hysterectomy 12/11/2019  . Vaginal dryness 12/11/2019  . Alteration consciousness 07/29/2018  . Extrinsic asthma without complication [id number] . Syncope  07/02/2018  . Left sided numbness and tingling 07/02/2018  . Gastroesophageal reflux disease without esophagitis 07/02/2018    2:16 PM, 04/19/20 CJosue HectorPT DPT  [facility] [phone] [address] [address]Phone: [phone]  Fax:  [phone] Name: Anne Eaton MRN: [id number] of Birth: [birth year]

## 2020-04-26 ENCOUNTER — Other Ambulatory Visit: Payer: Self-pay | Admitting: Adult Health

## 2020-04-26 DIAGNOSIS — E161 Other hypoglycemia: Secondary | ICD-10-CM

## 2020-05-08 DIAGNOSIS — M79601 Pain in right arm: Secondary | ICD-10-CM | POA: Diagnosis not present

## 2020-05-08 DIAGNOSIS — M25511 Pain in right shoulder: Secondary | ICD-10-CM | POA: Diagnosis not present

## 2020-05-10 ENCOUNTER — Ambulatory Visit (INDEPENDENT_AMBULATORY_CARE_PROVIDER_SITE_OTHER): Payer: BC Managed Care – PPO | Admitting: Specialist

## 2020-05-10 ENCOUNTER — Encounter: Payer: Self-pay | Admitting: Specialist

## 2020-05-10 ENCOUNTER — Other Ambulatory Visit: Payer: Self-pay

## 2020-05-10 ENCOUNTER — Ambulatory Visit: Payer: Self-pay

## 2020-05-10 VITALS — BP 110/74 | HR 73 | Ht 64.0 in | Wt 232.0 lb

## 2020-05-10 DIAGNOSIS — R29898 Other symptoms and signs involving the musculoskeletal system: Secondary | ICD-10-CM

## 2020-05-10 DIAGNOSIS — M79601 Pain in right arm: Secondary | ICD-10-CM

## 2020-05-10 DIAGNOSIS — M25511 Pain in right shoulder: Secondary | ICD-10-CM

## 2020-05-10 NOTE — Addendum Note (Signed)
Addended by: Vira Browns on: 05/10/2020 04:41 PM   Modules accepted: Orders

## 2020-05-10 NOTE — Patient Instructions (Signed)
Avoid overhead lifting and overhead use of the arms. Do not lift greater than 5-10 lbs. Adjust head rest in vehicle to prevent hyperextension if rear ended. Take extra precautions to avoid falling.Avoid frequent bending and stooping  No lifting greater than 10 lbs. May use ice or moist heat for pain. Weight loss is of benefit. Best medication for lumbar disc disease is arthritis medications like motrin, celebrex and naprosyn. Exercise is important to improve your indurance and does allow people to function better inspite of back pain.

## 2020-05-10 NOTE — Progress Notes (Signed)
Office Visit Note   Patient: Anne Eaton           Date of Birth: [birth year]           MRN: [id number] Visit Date: 05/10/2020              Requested by: No referring provider defined for this encounter. PCP: Patient, No Pcp Per   Assessment & Plan: Visit Diagnoses:  1. Right shoulder pain, unspecified chronicity   2. Pain in right arm   3. Right arm weakness     Plan:Avoid overhead lifting and overhead use of the arms. Do not lift greater than 5-10 lbs. Adjust head rest in vehicle to prevent hyperextension if rear ended. Take extra precautions to avoid falling.Avoid frequent bending and stooping  No lifting greater than 10 lbs. May use ice or moist heat for pain. Weight loss is of benefit. Best medication for lumbar disc disease is arthritis medications like motrin, celebrex and naprosyn. Exercise is important to improve your indurance and does allow people to function better inspite of back pain.   Follow-Up Instructions: No follow-ups on file.   Orders:  Orders Placed This Encounter  Procedures  . XR Shoulder Right  . XR Cervical Spine 2 or 3 views   No orders of the defined types were placed in this encounter.     Procedures: No procedures performed   Clinical Data: No additional findings.   Subjective: Chief Complaint  Patient presents with  . Right Shoulder - Pain    34 year old female right handed with history of back pain, was being seen for low back pain. Just before Thanksgiving she began having onset of right scapular pain and pain into the right arm. Before Christmas one week ago she began experiencing weakness into the right  Arm. She has been in PT for her lumbar spine and asks if they have contacted me about the weakness.    Review of Systems   Objective: Vital Signs: BP 110/74 (BP Location: Left Arm, Patient Position: Sitting)   Pulse 73   Ht 5\' 4"  (1.626 m)   Wt 232 lb (105.2 kg)   BMI 39.82 kg/m   Physical  Exam Constitutional:      Appearance: She is well-developed and well-nourished.  HENT:     Head: Normocephalic and atraumatic.  Eyes:     Extraocular Movements: EOM normal.     Pupils: Pupils are equal, round, and reactive to light.  Pulmonary:     Effort: Pulmonary effort is normal.     Breath sounds: Normal breath sounds.  Abdominal:     General: Bowel sounds are normal.     Palpations: Abdomen is soft.  Musculoskeletal:        General: Normal range of motion.     Cervical back: Normal range of motion and neck supple.  Skin:    General: Skin is warm and dry.  Neurological:     Mental Status: She is alert and oriented to person, place, and time.  Psychiatric:        Mood and Affect: Mood and affect normal.        Behavior: Behavior normal.        Thought Content: Thought content normal.        Judgment: Judgment normal.     Ortho Exam  Specialty Comments:  No specialty comments available.  Imaging: No results found.   PMFS History: Patient Active Problem List   Diagnosis Date Noted  .  Hot flashes 01/27/2020  . Dyspareunia in female 01/27/2020  . Epidermal cyst of vulva 01/27/2020  . Other fatigue 12/11/2019  . Night sweats 12/11/2019  . Dyspareunia, female 12/11/2019  . Hx of ovarian cyst 12/11/2019  . History of partial hysterectomy 12/11/2019  . Vaginal dryness 12/11/2019  . Alteration consciousness 07/29/2018  . Extrinsic asthma without complication 07/02/2018  . Syncope 07/02/2018  . Left sided numbness and tingling 07/02/2018  . Gastroesophageal reflux disease without esophagitis 07/02/2018   Past Medical History:  Diagnosis Date  . Asthma   . Hypercholesteremia   . Hypoglycemia   . Loss of consciousness (HCC)     Family History  Problem Relation Age of Onset  . Basal cell carcinoma Mother   . Hypertension Father   . Atrial fibrillation Father   . Hyperlipidemia Father   . Colon cancer Maternal Grandmother   . Breast cancer Maternal  Grandmother   . Varicose Veins Maternal Grandmother   . Arthritis Maternal Grandfather   . Diabetes Maternal Grandfather   . Heart disease Maternal Grandfather   . Heart failure Maternal Grandfather   . Heart attack Maternal Grandfather   . Hypertension Maternal Grandfather   . Hyperlipidemia Maternal Grandfather   . Arthritis Paternal Grandmother   . COPD Paternal Grandmother   . Hypertension Paternal Grandmother   . Aortic stenosis Paternal Grandmother   . Stroke Paternal Grandmother   . Cataracts Paternal Grandmother   . Macular degeneration Paternal Grandmother   . Varicose Veins Paternal Grandmother   . Diabetes Paternal Grandfather   . Hypertension Paternal Grandfather   . Heart attack Paternal Grandfather   . Asthma Brother   . ADD / ADHD Son   . Asthma Son   . ADD / ADHD Son   . Asthma Son   . ADD / ADHD Son   . Asthma Son   . Alcohol abuse Maternal Uncle   . Arthritis Maternal Uncle   . Diabetes Maternal Uncle   . Hypertension Maternal Uncle   . Hyperlipidemia Maternal Uncle   . COPD Maternal Aunt   . Sleep apnea Maternal Aunt   . Diabetes Maternal Aunt   . Heart disease Maternal Aunt   . Lung cancer Maternal Uncle   . Prostate cancer Paternal Uncle   . Breast cancer Paternal Aunt   . Obesity Paternal Aunt   . Diabetes Paternal Uncle   . Heart attack Paternal Uncle   . Diabetes Paternal Uncle   . Heart attack Paternal Uncle   . Macular degeneration Paternal Uncle   . Diabetes Paternal Uncle   . Hearing loss Paternal Uncle   . Diabetes Paternal Uncle   . Diabetes Paternal Uncle   . Obesity Paternal Uncle   . Diabetes Paternal Aunt   . Hypertension Maternal Aunt   . Hypertension Maternal Aunt     Past Surgical History:  Procedure Laterality Date  . ABDOMINAL HYSTERECTOMY    . CESAREAN SECTION     x 3  . GALLBLADDER SURGERY    . TONSILLECTOMY    . WISDOM TOOTH EXTRACTION     Social History   Occupational History  . Occupation: Unemployed   Tobacco Use  . Smoking status: Current Some Day Smoker    Packs/day: 0.50    Types: Cigarettes  . Smokeless tobacco: Former Neurosurgeon  . Tobacco comment: 0.5 - 1 ppd  Vaping Use  . Vaping Use: Every day  . Substances: Nicotine, Flavoring  Substance and Sexual Activity  .  Alcohol use: Yes    Comment: rarely  . Drug use: Never  . Sexual activity: Yes    Birth control/protection: Surgical    Comment: hyst

## 2020-06-03 ENCOUNTER — Other Ambulatory Visit: Payer: Self-pay | Admitting: Adult Health

## 2020-06-04 NOTE — Telephone Encounter (Signed)
Verbal given to pharmacy. JSY 

## 2020-06-06 DIAGNOSIS — M25551 Pain in right hip: Secondary | ICD-10-CM | POA: Diagnosis not present

## 2020-06-06 DIAGNOSIS — M7989 Other specified soft tissue disorders: Secondary | ICD-10-CM | POA: Diagnosis not present

## 2020-06-06 DIAGNOSIS — R7301 Impaired fasting glucose: Secondary | ICD-10-CM | POA: Diagnosis not present

## 2020-06-08 ENCOUNTER — Ambulatory Visit
Admission: RE | Admit: 2020-06-08 | Discharge: 2020-06-08 | Disposition: A | Discharge status: Home / Self Care | Payer: BC Managed Care – PPO | Source: Ambulatory Visit | Attending: Specialist | Admitting: Specialist

## 2020-06-08 DIAGNOSIS — M25511 Pain in right shoulder: Secondary | ICD-10-CM

## 2020-06-08 DIAGNOSIS — M50221 Other cervical disc displacement at C4-C5 level: Secondary | ICD-10-CM | POA: Diagnosis not present

## 2020-06-08 DIAGNOSIS — M79601 Pain in right arm: Secondary | ICD-10-CM

## 2020-06-08 DIAGNOSIS — M2578 Osteophyte, vertebrae: Secondary | ICD-10-CM | POA: Diagnosis not present

## 2020-06-08 DIAGNOSIS — R29898 Other symptoms and signs involving the musculoskeletal system: Secondary | ICD-10-CM

## 2020-06-08 DIAGNOSIS — M50222 Other cervical disc displacement at C5-C6 level: Secondary | ICD-10-CM | POA: Diagnosis not present

## 2020-06-11 DIAGNOSIS — M7989 Other specified soft tissue disorders: Secondary | ICD-10-CM | POA: Diagnosis not present

## 2020-06-12 DIAGNOSIS — Z01818 Encounter for other preprocedural examination: Secondary | ICD-10-CM | POA: Diagnosis not present

## 2020-06-12 DIAGNOSIS — Z20822 Contact with and (suspected) exposure to covid-19: Secondary | ICD-10-CM | POA: Diagnosis not present

## 2020-06-20 DIAGNOSIS — N6031 Fibrosclerosis of right breast: Secondary | ICD-10-CM | POA: Diagnosis not present

## 2020-06-20 DIAGNOSIS — M549 Dorsalgia, unspecified: Secondary | ICD-10-CM | POA: Diagnosis not present

## 2020-06-20 DIAGNOSIS — M542 Cervicalgia: Secondary | ICD-10-CM | POA: Diagnosis not present

## 2020-06-20 DIAGNOSIS — N62 Hypertrophy of breast: Secondary | ICD-10-CM | POA: Diagnosis not present

## 2020-06-20 DIAGNOSIS — N6011 Diffuse cystic mastopathy of right breast: Secondary | ICD-10-CM | POA: Diagnosis not present

## 2020-06-20 DIAGNOSIS — N6012 Diffuse cystic mastopathy of left breast: Secondary | ICD-10-CM | POA: Diagnosis not present

## 2020-06-20 DIAGNOSIS — G8918 Other acute postprocedural pain: Secondary | ICD-10-CM | POA: Diagnosis not present

## 2020-06-20 HISTORY — PX: REDUCTION MAMMAPLASTY: SUR839

## 2020-07-04 ENCOUNTER — Other Ambulatory Visit: Payer: Self-pay | Admitting: Specialist

## 2020-08-03 DIAGNOSIS — R29898 Other symptoms and signs involving the musculoskeletal system: Secondary | ICD-10-CM | POA: Diagnosis not present

## 2020-08-03 DIAGNOSIS — M5412 Radiculopathy, cervical region: Secondary | ICD-10-CM | POA: Diagnosis not present

## 2020-09-16 ENCOUNTER — Other Ambulatory Visit: Payer: Self-pay

## 2020-09-16 ENCOUNTER — Encounter: Payer: Self-pay | Admitting: Emergency Medicine

## 2020-09-16 ENCOUNTER — Ambulatory Visit
Admission: EM | Admit: 2020-09-16 | Discharge: 2020-09-16 | Disposition: A | Discharge status: Home / Self Care | Payer: BC Managed Care – PPO | Attending: Family Medicine | Admitting: Family Medicine

## 2020-09-16 DIAGNOSIS — R059 Cough, unspecified: Secondary | ICD-10-CM

## 2020-09-16 DIAGNOSIS — J029 Acute pharyngitis, unspecified: Secondary | ICD-10-CM | POA: Diagnosis not present

## 2020-09-16 DIAGNOSIS — J209 Acute bronchitis, unspecified: Secondary | ICD-10-CM

## 2020-09-16 MED ORDER — PREDNISONE 20 MG PO TABS: 40.0000 mg | ORAL_TABLET | Freq: Every day | ORAL | 0 refills | Status: DC

## 2020-09-16 MED ORDER — PULMICORT FLEXHALER 90 MCG/ACT IN AEPB: 2.0000 | INHALATION_SPRAY | Freq: Two times a day (BID) | RESPIRATORY_TRACT | 0 refills | Status: AC

## 2020-09-16 MED ORDER — ALBUTEROL SULFATE HFA 108 (90 BASE) MCG/ACT IN AERS
2.0000 | INHALATION_SPRAY | Freq: Once | RESPIRATORY_TRACT | Status: AC
  Administered 2020-09-16: 2 via RESPIRATORY_TRACT

## 2020-09-16 MED ORDER — ALBUTEROL SULFATE HFA 108 (90 BASE) MCG/ACT IN AERS: 1.0000 | INHALATION_SPRAY | Freq: Four times a day (QID) | RESPIRATORY_TRACT | 0 refills | Status: DC | PRN

## 2020-09-16 MED ORDER — PROMETHAZINE-DM 6.25-15 MG/5ML PO SYRP: 5.0000 mL | ORAL_SOLUTION | Freq: Four times a day (QID) | ORAL | 0 refills | Status: DC | PRN

## 2020-09-16 NOTE — ED Triage Notes (Signed)
Started loosing voice on Thursday. Sore throat when coughing.  Chest congestion.

## 2020-09-16 NOTE — Discharge Instructions (Signed)
Continue Allegra however recommend adding over-the-counter Sudafed 60 mg every 4-6 hours as needed at the onset of congestion. Resume use of your albuterol inhaler 2 puffs every 4-6 hours as needed for shortness of breath coughing or wheezing. Promethazine DM as needed for cough.  Complete entire course of prednisone.

## 2020-09-16 NOTE — ED Provider Notes (Signed)
[facility]    CSN: [id number] Arrival date & time: 09/16/20  6073      History   Chief Complaint No chief complaint on file.   HPI Anne Eaton is a 35 y.o. female.   HPI Patient presents today for evaluation of a persistent cough, sore throat with hoarseness of voice.  No known sick contacts. Afebrile. Has attempted relief with allergy medication. She has asthma and is smoker, endorses some SOB and occasional wheezing. Is currently without her inhaler -thinks she misplaced it. Endorses nasal drainage and congestion with headache. No fever, BA, or generalized weakness. Past Medical History:  Diagnosis Date  . Asthma   . Hypercholesteremia   . Hypoglycemia   . Loss of consciousness [facility])     Patient Active Problem List   Diagnosis Date Noted  . Hot flashes 01/27/2020  . Dyspareunia in female 01/27/2020  . Epidermal cyst of vulva 01/27/2020  . Other fatigue 12/11/2019  . Night sweats 12/11/2019  . Dyspareunia, female 12/11/2019  . Hx of ovarian cyst 12/11/2019  . History of partial hysterectomy 12/11/2019  . Vaginal dryness 12/11/2019  . Alteration consciousness 07/29/2018  . Extrinsic asthma without complication 07/02/2018  . Syncope 07/02/2018  . Left sided numbness and tingling 07/02/2018  . Gastroesophageal reflux disease without esophagitis 07/02/2018    Past Surgical History:  Procedure Laterality Date  . ABDOMINAL HYSTERECTOMY    . CESAREAN SECTION     x 3  . GALLBLADDER SURGERY    . TONSILLECTOMY    . WISDOM TOOTH EXTRACTION      OB History    Gravida  5   Para  3   Term      Preterm      AB  2   Living  3     SAB  2   IAB      Ectopic      Multiple      Live Births  3            Home Medications    Prior to Admission medications   Medication Sig Start Date End Date Taking? Authorizing Provider  albuterol (VENTOLIN HFA) 108 (90 Base) MCG/ACT inhaler Inhale 1-2 puffs into the lungs every 6 (six) hours as  needed for wheezing or shortness of breath. 09/16/20  Yes Bing Neighbors, FNP  Budesonide (PULMICORT FLEXHALER) 90 MCG/ACT inhaler Inhale 2 puffs into the lungs 2 (two) times daily. 09/16/20  Yes Bing Neighbors, FNP  predniSONE (DELTASONE) 20 MG tablet Take 2 tablets (40 mg total) by mouth daily with breakfast. 09/16/20  Yes Bing Neighbors, FNP  promethazine-dextromethorphan (PROMETHAZINE-DM) 6.25-15 MG/5ML syrup Take 5 mLs by mouth 4 (four) times daily as needed for cough. 09/16/20  Yes Bing Neighbors, FNP  Estradiol 0.75 MG/1.25 GM (0.06%) topical gel Place 1.25 g onto the skin daily. 12/11/19   Moshe Cipro, NP  fexofenadine-pseudoephedrine (ALLEGRA-D 24) 180-240 MG 24 hr tablet Take 1 tablet by mouth daily as needed. 08/03/19   Arvilla Market, DO  gabapentin (NEURONTIN) 300 MG capsule Take 1 capsule (300 mg total) by mouth at bedtime. 03/09/20   Kerrin Champagne, MD  omeprazole (PRILOSEC) 20 MG capsule Take 1 capsule (20 mg total) by mouth daily. 08/03/19   Arvilla Market, DO  PREMARIN vaginal cream USE 1 GM IN VAGINA DAILY FOR 2 WEEKS THEN 2-3 X A WEEK 06/04/20   Adline Potter, NP  traMADol-acetaminophen (ULTRACET) 37.5-325 MG tablet  TAKE 1 TABLET BY MOUTH EVERY 6 HOURS AS NEEDED 07/04/20   Kerrin Champagne, MD    Family History Family History  Problem Relation Age of Onset  . Basal cell carcinoma Mother   . Hypertension Father   . Atrial fibrillation Father   . Hyperlipidemia Father   . Colon cancer Maternal Grandmother   . Breast cancer Maternal Grandmother   . Varicose Veins Maternal Grandmother   . Arthritis Maternal Grandfather   . Diabetes Maternal Grandfather   . Heart disease Maternal Grandfather   . Heart failure Maternal Grandfather   . Heart attack Maternal Grandfather   . Hypertension Maternal Grandfather   . Hyperlipidemia Maternal Grandfather   . Arthritis Paternal Grandmother   . COPD Paternal Grandmother   . Hypertension Paternal  Grandmother   . Aortic stenosis Paternal Grandmother   . Stroke Paternal Grandmother   . Cataracts Paternal Grandmother   . Macular degeneration Paternal Grandmother   . Varicose Veins Paternal Grandmother   . Diabetes Paternal Grandfather   . Hypertension Paternal Grandfather   . Heart attack Paternal Grandfather   . Asthma Brother   . ADD / ADHD Son   . Asthma Son   . ADD / ADHD Son   . Asthma Son   . ADD / ADHD Son   . Asthma Son   . Alcohol abuse Maternal Uncle   . Arthritis Maternal Uncle   . Diabetes Maternal Uncle   . Hypertension Maternal Uncle   . Hyperlipidemia Maternal Uncle   . COPD Maternal Aunt   . Sleep apnea Maternal Aunt   . Diabetes Maternal Aunt   . Heart disease Maternal Aunt   . Lung cancer Maternal Uncle   . Prostate cancer Paternal Uncle   . Breast cancer Paternal Aunt   . Obesity Paternal Aunt   . Diabetes Paternal Uncle   . Heart attack Paternal Uncle   . Diabetes Paternal Uncle   . Heart attack Paternal Uncle   . Macular degeneration Paternal Uncle   . Diabetes Paternal Uncle   . Hearing loss Paternal Uncle   . Diabetes Paternal Uncle   . Diabetes Paternal Uncle   . Obesity Paternal Uncle   . Diabetes Paternal Aunt   . Hypertension Maternal Aunt   . Hypertension Maternal Aunt     Social History Social History   Tobacco Use  . Smoking status: Current Some Day Smoker    Packs/day: 0.50    Types: Cigarettes  . Smokeless tobacco: Former Neurosurgeon  . Tobacco comment: 0.5 - 1 ppd  Vaping Use  . Vaping Use: Every day  . Substances: Nicotine, Flavoring  Substance Use Topics  . Alcohol use: Yes    Comment: rarely  . Drug use: Never     Allergies   Augmentin [amoxicillin-pot clavulanate], Morphine and related, and Trazodone and nefazodone   Review of Systems Review of Systems Pertinent negatives listed in HPI   Physical Exam Triage Vital Signs ED Triage Vitals  Enc Vitals Group     BP --      Pulse Rate 09/16/20 0933 83     Resp  09/16/20 0933 19     Temp 09/16/20 0933 98.4 F (36.9 C)     Temp Source 09/16/20 0933 Oral     SpO2 09/16/20 0933 96 %     Weight --      Height --      Head Circumference --      Peak Flow --  Pain Score 09/16/20 0931 0     Pain Loc --      Pain Edu? --      Excl. in GC? --    No data found.  Updated Vital Signs Pulse 83   Temp 98.4 F (36.9 C) (Oral)   Resp 19   SpO2 96%   Visual Acuity Right Eye Distance:   Left Eye Distance:   Bilateral Distance:    Right Eye Near:   Left Eye Near:    Bilateral Near:     Physical Exam   General Appearance:    Alert, cooperative, no distress  HENT:   Normocephalic, ears normal, nares mucosal edema with congestion, rhinorrhea, oropharynx    Eyes:    PERRL, conjunctiva/corneas clear, EOM's intact       Lungs:     Bilateral expiratory wheezing present, coarse lung sound, respirations unlabored  Heart:    Regular rate and rhythm  Neurologic:   Awake, alert, oriented x 3. No apparent focal neurological           defect.     UC Treatments / Results  Labs (all labs ordered are listed, but only abnormal results are displayed) Labs Reviewed - No data to display  EKG   Radiology No results found.  Procedures Procedures (including critical care time)  Medications Ordered in UC Medications  albuterol (VENTOLIN HFA) 108 (90 Base) MCG/ACT inhaler 2 puff (2 puffs Inhalation Given 09/16/20 1005)    Initial Impression / Assessment and Plan / UC Course  I have reviewed the triage vital signs and the nursing notes.  Pertinent labs & imaging results that were available during my care of the patient were reviewed by me and considered in my medical decision making (see chart for details).      Acute bronchitis and cough. Pt with history of asthma. Resume daily preventative regimen of antihistamine and Pulmicort. Albuterol inhaler given here in clinic . Remainder of treatment per discharge  Medications Follow-up with PCP as  needed  Final Clinical Impressions(s) / UC Diagnoses   Final diagnoses:  Acute bronchitis, unspecified organism  Cough     Discharge Instructions     Continue Allegra however recommend adding over-the-counter Sudafed 60 mg every 4-6 hours as needed at the onset of congestion. Resume use of your albuterol inhaler 2 puffs every 4-6 hours as needed for shortness of breath coughing or wheezing. Promethazine DM as needed for cough.  Complete entire course of prednisone.    ED Prescriptions    Medication Sig Dispense Auth. Provider   promethazine-dextromethorphan (PROMETHAZINE-DM) 6.25-15 MG/5ML syrup Take 5 mLs by mouth 4 (four) times daily as needed for cough. 140 mL Bing Neighbors, FNP   predniSONE (DELTASONE) 20 MG tablet Take 2 tablets (40 mg total) by mouth daily with breakfast. 10 tablet Bing Neighbors, FNP   albuterol (VENTOLIN HFA) 108 (90 Base) MCG/ACT inhaler Inhale 1-2 puffs into the lungs every 6 (six) hours as needed for wheezing or shortness of breath. 1 each Bing Neighbors, FNP   Budesonide (PULMICORT FLEXHALER) 90 MCG/ACT inhaler Inhale 2 puffs into the lungs 2 (two) times daily. 1 each Bing Neighbors, FNP     PDMP not reviewed this encounter.   Bing Neighbors, FNP 09/16/20 1246

## 2020-10-01 DIAGNOSIS — K219 Gastro-esophageal reflux disease without esophagitis: Secondary | ICD-10-CM | POA: Diagnosis not present

## 2020-10-01 DIAGNOSIS — N3946 Mixed incontinence: Secondary | ICD-10-CM | POA: Diagnosis not present

## 2020-10-01 DIAGNOSIS — R631 Polydipsia: Secondary | ICD-10-CM | POA: Diagnosis not present

## 2020-10-01 DIAGNOSIS — R1110 Vomiting, unspecified: Secondary | ICD-10-CM | POA: Diagnosis not present

## 2020-10-04 DIAGNOSIS — M25561 Pain in right knee: Secondary | ICD-10-CM | POA: Diagnosis not present

## 2020-10-04 DIAGNOSIS — M542 Cervicalgia: Secondary | ICD-10-CM | POA: Diagnosis not present

## 2020-10-04 DIAGNOSIS — M2351 Chronic instability of knee, right knee: Secondary | ICD-10-CM | POA: Diagnosis not present

## 2020-10-04 DIAGNOSIS — G8929 Other chronic pain: Secondary | ICD-10-CM | POA: Diagnosis not present

## 2020-10-04 DIAGNOSIS — M545 Low back pain, unspecified: Secondary | ICD-10-CM | POA: Diagnosis not present

## 2020-10-27 IMAGING — DX DG CHEST 2V
2 series · 2 of 2 positions shown · non-contrast
Comparison: None.

CLINICAL DATA: Acute left upper quadrant abdominal pain.

EXAM:
CHEST - 2 VIEW

[x chest ap]
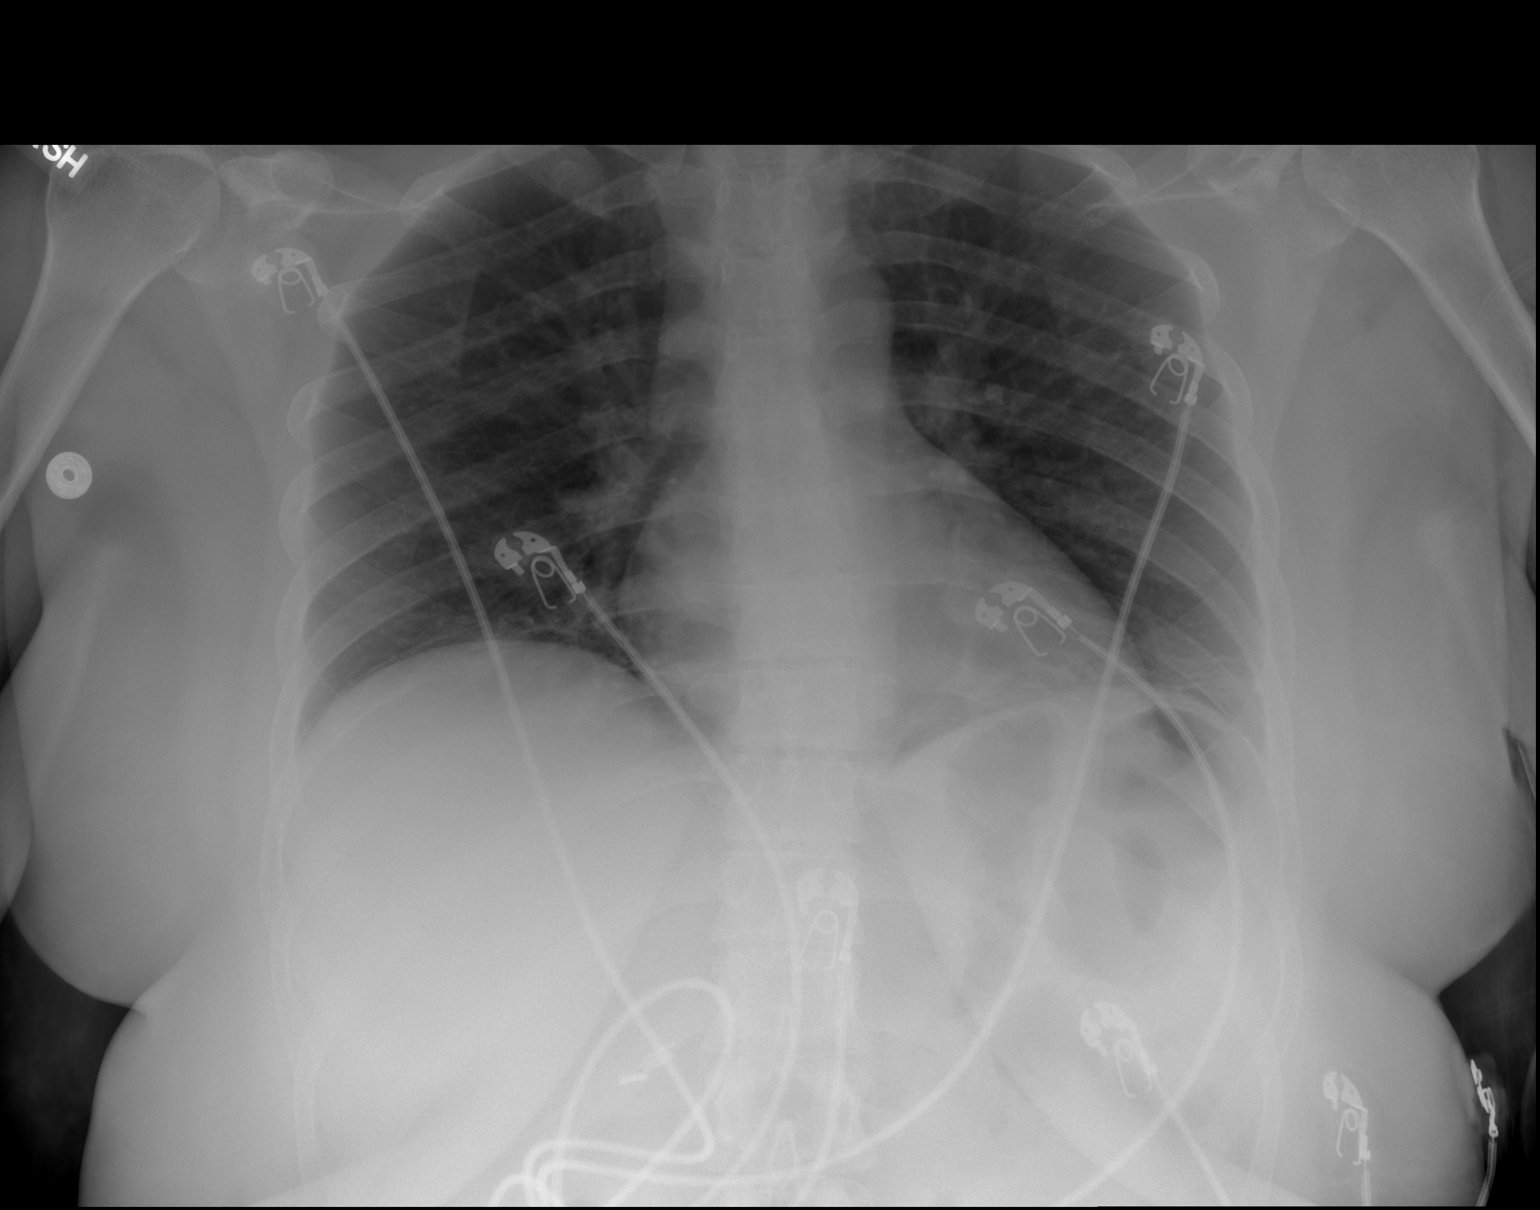

[w chest lat]
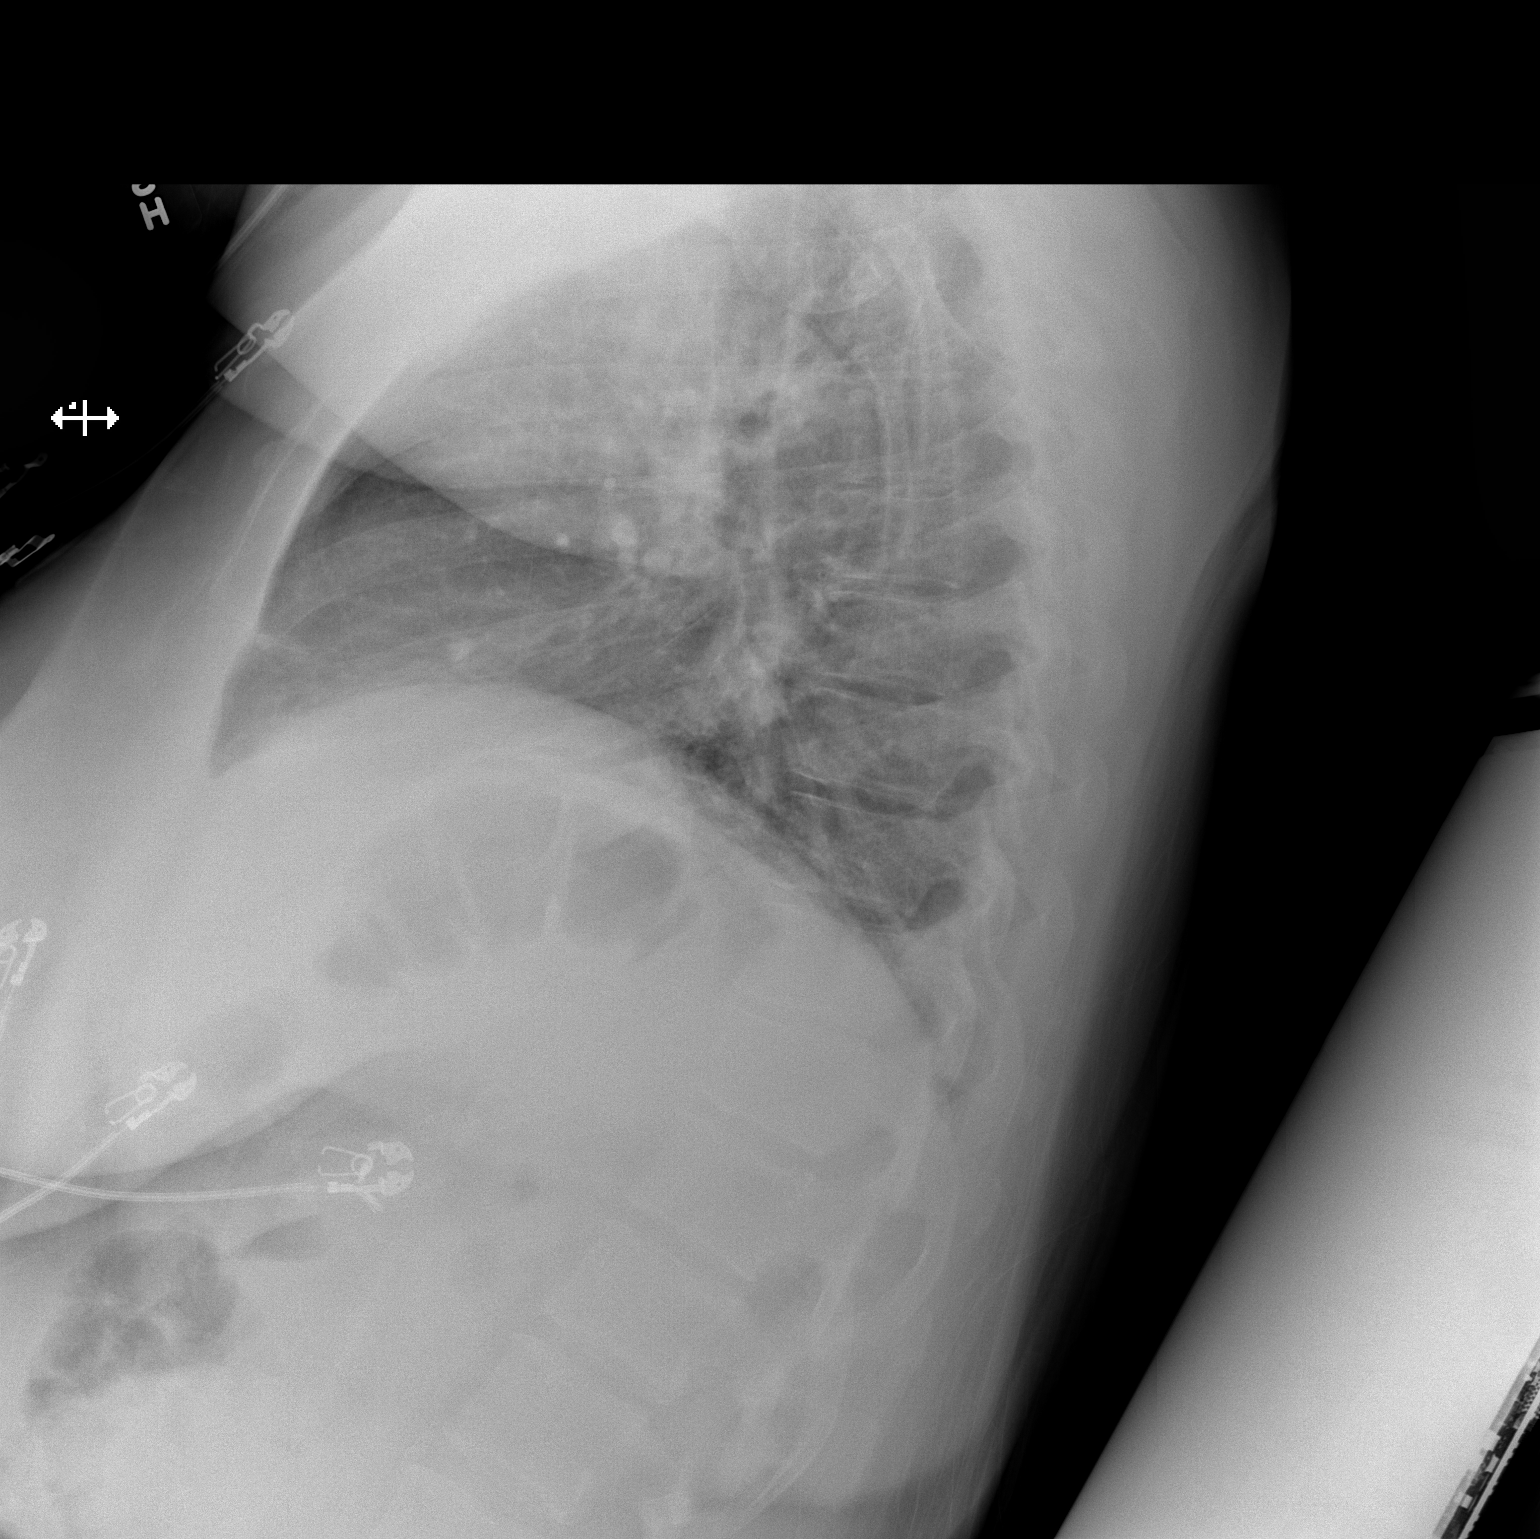

[2 of 2 positions shown; findings below may reference images not displayed]

FINDINGS: The heart size and mediastinal contours are within normal limits.
Both lungs are clear. No pneumothorax or pleural effusion is noted.
The visualized skeletal structures are unremarkable.
IMPRESSION: No active cardiopulmonary disease.

## 2020-10-30 DIAGNOSIS — M25461 Effusion, right knee: Secondary | ICD-10-CM | POA: Diagnosis not present

## 2020-11-13 DIAGNOSIS — R1314 Dysphagia, pharyngoesophageal phase: Secondary | ICD-10-CM | POA: Diagnosis not present

## 2020-11-13 DIAGNOSIS — M545 Low back pain, unspecified: Secondary | ICD-10-CM | POA: Diagnosis not present

## 2020-11-13 DIAGNOSIS — G8929 Other chronic pain: Secondary | ICD-10-CM | POA: Diagnosis not present

## 2020-11-13 DIAGNOSIS — R1110 Vomiting, unspecified: Secondary | ICD-10-CM | POA: Diagnosis not present

## 2020-11-13 DIAGNOSIS — M25561 Pain in right knee: Secondary | ICD-10-CM | POA: Diagnosis not present

## 2020-11-13 DIAGNOSIS — K219 Gastro-esophageal reflux disease without esophagitis: Secondary | ICD-10-CM | POA: Diagnosis not present

## 2020-11-15 DIAGNOSIS — M222X1 Patellofemoral disorders, right knee: Secondary | ICD-10-CM | POA: Diagnosis not present

## 2020-11-15 DIAGNOSIS — M25461 Effusion, right knee: Secondary | ICD-10-CM | POA: Diagnosis not present

## 2020-11-23 DIAGNOSIS — M545 Low back pain, unspecified: Secondary | ICD-10-CM | POA: Diagnosis not present

## 2020-11-23 DIAGNOSIS — G8929 Other chronic pain: Secondary | ICD-10-CM | POA: Diagnosis not present

## 2020-11-28 DIAGNOSIS — R1110 Vomiting, unspecified: Secondary | ICD-10-CM | POA: Diagnosis not present

## 2020-11-28 DIAGNOSIS — K200 Eosinophilic esophagitis: Secondary | ICD-10-CM | POA: Diagnosis not present

## 2020-11-28 DIAGNOSIS — R1314 Dysphagia, pharyngoesophageal phase: Secondary | ICD-10-CM | POA: Diagnosis not present

## 2020-11-28 DIAGNOSIS — K219 Gastro-esophageal reflux disease without esophagitis: Secondary | ICD-10-CM | POA: Diagnosis not present

## 2021-02-05 DIAGNOSIS — Z6841 Body Mass Index (BMI) 40.0 and over, adult: Secondary | ICD-10-CM | POA: Diagnosis not present

## 2021-02-19 DIAGNOSIS — M25551 Pain in right hip: Secondary | ICD-10-CM | POA: Diagnosis not present

## 2021-02-19 DIAGNOSIS — M25561 Pain in right knee: Secondary | ICD-10-CM | POA: Diagnosis not present

## 2021-02-19 DIAGNOSIS — G8929 Other chronic pain: Secondary | ICD-10-CM | POA: Diagnosis not present

## 2021-02-27 ENCOUNTER — Ambulatory Visit
Admission: EM | Admit: 2021-02-27 | Discharge: 2021-02-27 | Disposition: A | Discharge status: Home / Self Care | Payer: BC Managed Care – PPO

## 2021-02-27 ENCOUNTER — Other Ambulatory Visit: Payer: Self-pay

## 2021-02-27 DIAGNOSIS — H9203 Otalgia, bilateral: Secondary | ICD-10-CM

## 2021-02-27 DIAGNOSIS — J069 Acute upper respiratory infection, unspecified: Secondary | ICD-10-CM | POA: Diagnosis not present

## 2021-02-27 DIAGNOSIS — J4540 Moderate persistent asthma, uncomplicated: Secondary | ICD-10-CM

## 2021-02-27 HISTORY — DX: Scoliosis, unspecified: M41.9

## 2021-02-27 MED ORDER — CETIRIZINE HCL 10 MG PO TABS: 10.0000 mg | ORAL_TABLET | Freq: Every day | ORAL | 0 refills | Status: AC

## 2021-02-27 MED ORDER — PROMETHAZINE-DM 6.25-15 MG/5ML PO SYRP: 5.0000 mL | ORAL_SOLUTION | Freq: Four times a day (QID) | ORAL | 0 refills | Status: AC | PRN

## 2021-02-27 MED ORDER — ALBUTEROL SULFATE HFA 108 (90 BASE) MCG/ACT IN AERS: 1.0000 | INHALATION_SPRAY | Freq: Four times a day (QID) | RESPIRATORY_TRACT | 0 refills | Status: AC | PRN

## 2021-02-27 MED ORDER — PREDNISONE 20 MG PO TABS: 40.0000 mg | ORAL_TABLET | Freq: Every day | ORAL | 0 refills | Status: AC

## 2021-02-27 NOTE — ED Triage Notes (Signed)
5 day h/o sore throat, bilateral ear pain, HA, productive cough and nasal drainage. Right ear pain > left. Pt c/o itchiness in the roof of her mouth. Has been taking mucinex with temporary relief.

## 2021-02-27 NOTE — ED Provider Notes (Signed)
[facility]   MRN: [id number] DOB: 14-Jun-1985  Subjective:   Anne Eaton is a 35 y.o. female presenting for 4 to 5-day history of acute onset bilateral ear pain, R>L, sinus congestion, productive cough.  Has a history of allergies and asthma.  She does not have any of her inhalers currently, was supposed to be on Pulmicort but the prior authorization was never completed.  She recently had COVID-19, does not want to be tested for this.  Has also had some chest pain and shortness of breath with her coughing but not outside of coughing.  Has used multiple over-the-counter medications.  Patient quit smoking 2 years ago.  No current facility-administered medications for this encounter.  Current Outpatient Medications:    hydrOXYzine (ATARAX/VISTARIL) 25 MG tablet, Take by mouth., Disp: , Rfl:    albuterol (VENTOLIN HFA) 108 (90 Base) MCG/ACT inhaler, Inhale 1-2 puffs into the lungs every 6 (six) hours as needed for wheezing or shortness of breath., Disp: 1 each, Rfl: 0   Budesonide (PULMICORT FLEXHALER) 90 MCG/ACT inhaler, Inhale 2 puffs into the lungs 2 (two) times daily., Disp: 1 each, Rfl: 0   Estradiol 0.75 MG/1.25 GM (0.06%) topical gel, Place 1.25 g onto the skin daily., Disp: 50 g, Rfl: 2   fexofenadine-pseudoephedrine (ALLEGRA-D 24) 180-240 MG 24 hr tablet, Take 1 tablet by mouth daily as needed., Disp: 30 tablet, Rfl: 3   gabapentin (NEURONTIN) 300 MG capsule, Take 1 capsule (300 mg total) by mouth at bedtime., Disp: 30 capsule, Rfl: 3   omeprazole (PRILOSEC) 20 MG capsule, Take 1 capsule (20 mg total) by mouth daily., Disp: 90 capsule, Rfl: 1   pantoprazole (PROTONIX) 40 MG tablet, Take 40 mg by mouth 2 (two) times daily., Disp: , Rfl:    predniSONE (DELTASONE) 20 MG tablet, Take 2 tablets (40 mg total) by mouth daily with breakfast., Disp: 10 tablet, Rfl: 0   pregabalin (LYRICA) 50 MG capsule, PLEASE SEE ATTACHED FOR DETAILED DIRECTIONS, Disp: , Rfl:    PREMARIN  vaginal cream, USE 1 GM IN VAGINA DAILY FOR 2 WEEKS THEN 2-3 X A WEEK, Disp: 30 g, Rfl: 2   promethazine-dextromethorphan (PROMETHAZINE-DM) 6.25-15 MG/5ML syrup, Take 5 mLs by mouth 4 (four) times daily as needed for cough., Disp: 140 mL, Rfl: 0   traMADol-acetaminophen (ULTRACET) 37.5-325 MG tablet, TAKE 1 TABLET BY MOUTH EVERY 6 HOURS AS NEEDED, Disp: 30 tablet, Rfl: 0   Allergies  Allergen Reactions   Augmentin [Amoxicillin-Pot Clavulanate] Other (See Comments)    Yeast infection   Morphine And Related Other (See Comments)    Made me crazy   Trazodone And Nefazodone     Headache     Past Medical History:  Diagnosis Date   Asthma    Hypercholesteremia    Hypoglycemia    Loss of consciousness (HCC)    Scoliosis      Past Surgical History:  Procedure Laterality Date   ABDOMINAL HYSTERECTOMY     CESAREAN SECTION     x 3   GALLBLADDER SURGERY     TONSILLECTOMY     WISDOM TOOTH EXTRACTION      Family History  Problem Relation Age of Onset   Basal cell carcinoma Mother    Hypertension Father    Atrial fibrillation Father    Hyperlipidemia Father    Colon cancer Maternal Grandmother    Breast cancer Maternal Grandmother    Varicose Veins Maternal Grandmother    Arthritis Maternal Grandfather  Diabetes Maternal Grandfather    Heart disease Maternal Grandfather    Heart failure Maternal Grandfather    Heart attack Maternal Grandfather    Hypertension Maternal Grandfather    Hyperlipidemia Maternal Grandfather    Arthritis Paternal Grandmother    COPD Paternal Grandmother    Hypertension Paternal Grandmother    Aortic stenosis Paternal Grandmother    Stroke Paternal Grandmother    Cataracts Paternal Grandmother    Macular degeneration Paternal Grandmother    Varicose Veins Paternal Grandmother    Diabetes Paternal Grandfather    Hypertension Paternal Grandfather    Heart attack Paternal Grandfather    Asthma Brother    ADD / ADHD Son    Asthma Son    ADD /  ADHD Son    Asthma Son    ADD / ADHD Son    Asthma Son    Alcohol abuse Maternal Uncle    Arthritis Maternal Uncle    Diabetes Maternal Uncle    Hypertension Maternal Uncle    Hyperlipidemia Maternal Uncle    COPD Maternal Aunt    Sleep apnea Maternal Aunt    Diabetes Maternal Aunt    Heart disease Maternal Aunt    Lung cancer Maternal Uncle    Prostate cancer Paternal Uncle    Breast cancer Paternal Aunt    Obesity Paternal Aunt    Diabetes Paternal Uncle    Heart attack Paternal Uncle    Diabetes Paternal Uncle    Heart attack Paternal Uncle    Macular degeneration Paternal Uncle    Diabetes Paternal Uncle    Hearing loss Paternal Uncle    Diabetes Paternal Uncle    Diabetes Paternal Uncle    Obesity Paternal Uncle    Diabetes Paternal Aunt    Hypertension Maternal Aunt    Hypertension Maternal Aunt     Social History   Tobacco Use   Smoking status: Former    Packs/day: 0.50    Types: Cigarettes    Quit date: 2020    Years since quitting: 2.8   Smokeless tobacco: Former   Tobacco comments:    0.5 - 1 ppd  Vaping Use   Vaping Use: Some days   Substances: Nicotine, Flavoring  Substance Use Topics   Alcohol use: Yes    Comment: rarely   Drug use: Never    ROS   Objective:   Vitals: BP 122/84 (BP Location: Right Arm)   Pulse 68   Temp 98.9 F (37.2 C) (Oral)   Resp 18   SpO2 95%   Physical Exam Constitutional:      General: She is not in acute distress.    Appearance: Normal appearance. She is well-developed. She is not ill-appearing, toxic-appearing or diaphoretic.  HENT:     Head: Normocephalic and atraumatic.     Right Ear: Ear canal and external ear normal. No drainage or tenderness. No middle ear effusion. Tympanic membrane is not erythematous.     Left Ear: Ear canal and external ear normal. No drainage or tenderness.  No middle ear effusion. Tympanic membrane is not erythematous.     Ears:     Comments: TMs opacified bilaterally but  otherwise intact and without signs of bacterial infection.    Nose: Congestion and rhinorrhea present.     Mouth/Throat:     Mouth: Mucous membranes are moist. No oral lesions.     Pharynx: No pharyngeal swelling, oropharyngeal exudate, posterior oropharyngeal erythema or uvula swelling.     Tonsils:  No tonsillar exudate or tonsillar abscesses.  Eyes:     Extraocular Movements: Extraocular movements intact.     Right eye: Normal extraocular motion.     Left eye: Normal extraocular motion.     Conjunctiva/sclera: Conjunctivae normal.     Pupils: Pupils are equal, round, and reactive to light.  Cardiovascular:     Rate and Rhythm: Normal rate and regular rhythm.     Pulses: Normal pulses.     Heart sounds: Normal heart sounds. No murmur heard.   No friction rub. No gallop.  Pulmonary:     Effort: Pulmonary effort is normal. No respiratory distress.     Breath sounds: Normal breath sounds. No stridor. No wheezing, rhonchi or rales.  Musculoskeletal:     Cervical back: Normal range of motion and neck supple.  Lymphadenopathy:     Cervical: No cervical adenopathy.  Skin:    General: Skin is warm and dry.     Findings: No rash.  Neurological:     General: No focal deficit present.     Mental Status: She is alert and oriented to person, place, and time.  Psychiatric:        Mood and Affect: Mood normal.        Behavior: Behavior normal.        Thought Content: Thought content normal.     Assessment and Plan :   PDMP not reviewed this encounter.  1. Viral URI with cough   2. Acute ear pain, bilateral   3. Moderate persistent asthma without complication    Patient refused COVID-19 test.  Recommended managing for viral respiratory illness with supportive care.  In light of her history of smoking, asthma we will also use a steroid course.  Refilled her albuterol inhaler.  Follow-up with her PCP, pulmonologist for recheck on her asthma.  Offered patient a chest x-ray but she refused  this as well. Counseled patient on potential for adverse effects with medications prescribed/recommended today, ER and return-to-clinic precautions discussed, patient verbalized understanding.    Wallis Bamberg, PA-C 02/27/21 1118

## 2021-03-21 ENCOUNTER — Other Ambulatory Visit: Payer: Self-pay | Admitting: Physician Assistant

## 2021-03-21 DIAGNOSIS — N63 Unspecified lump in unspecified breast: Secondary | ICD-10-CM

## 2021-03-21 DIAGNOSIS — Z09 Encounter for follow-up examination after completed treatment for conditions other than malignant neoplasm: Secondary | ICD-10-CM

## 2021-03-22 DIAGNOSIS — M797 Fibromyalgia: Secondary | ICD-10-CM | POA: Diagnosis not present

## 2021-03-22 DIAGNOSIS — G894 Chronic pain syndrome: Secondary | ICD-10-CM | POA: Diagnosis not present

## 2021-04-01 DIAGNOSIS — R0683 Snoring: Secondary | ICD-10-CM | POA: Diagnosis not present

## 2021-04-01 DIAGNOSIS — R519 Headache, unspecified: Secondary | ICD-10-CM | POA: Diagnosis not present

## 2021-04-01 DIAGNOSIS — H539 Unspecified visual disturbance: Secondary | ICD-10-CM | POA: Diagnosis not present

## 2021-04-30 ENCOUNTER — Other Ambulatory Visit: Payer: BC Managed Care – PPO

## 2021-05-03 DIAGNOSIS — G894 Chronic pain syndrome: Secondary | ICD-10-CM | POA: Diagnosis not present

## 2021-05-03 DIAGNOSIS — M797 Fibromyalgia: Secondary | ICD-10-CM | POA: Diagnosis not present

## 2021-12-14 IMAGING — US US BREAST*R* LIMITED INC AXILLA
1 series · 5 of 5 positions shown · non-contrast
Comparison: Previous exam(s).

CLINICAL DATA: Short-term interval follow-up of probable benign
apocrine metaplasia in the right breast seen mammographically and
sonographically and probable benign mass seen in the left breast
only seen mammographically on prior exam dated 11/30/2018. Patient
also complains of spontaneous left nipple discharge for many years.
She had a recent MRI on 12/24/2018 were no abnormality was seen in
the left breast. Short-term interval follow-up of probable benign
non mass enhancement in the right breast was recommended.

EXAM:
DIGITAL DIAGNOSTIC BILATERAL MAMMOGRAM WITH CAD AND TOMO
ULTRASOUND BILATERAL BREAST

[Series 1: us breast*right* limited inc axilla · 0.06mm/px · 5 of 5 slices shown]
[im 1/5]
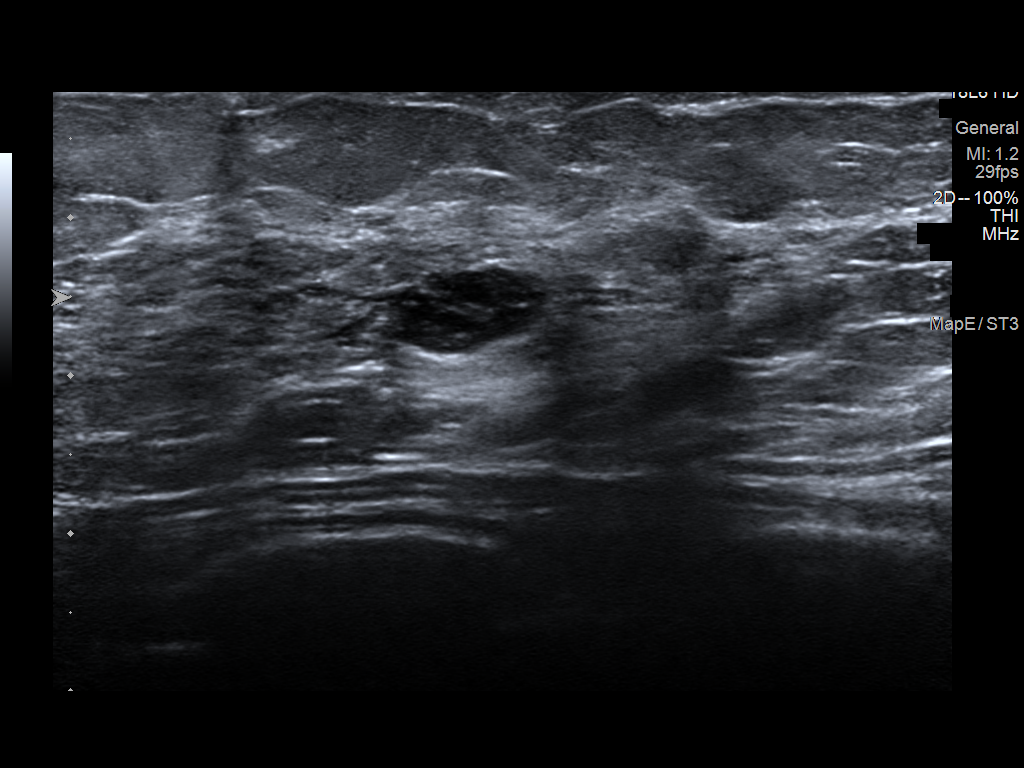
[im 2/5]
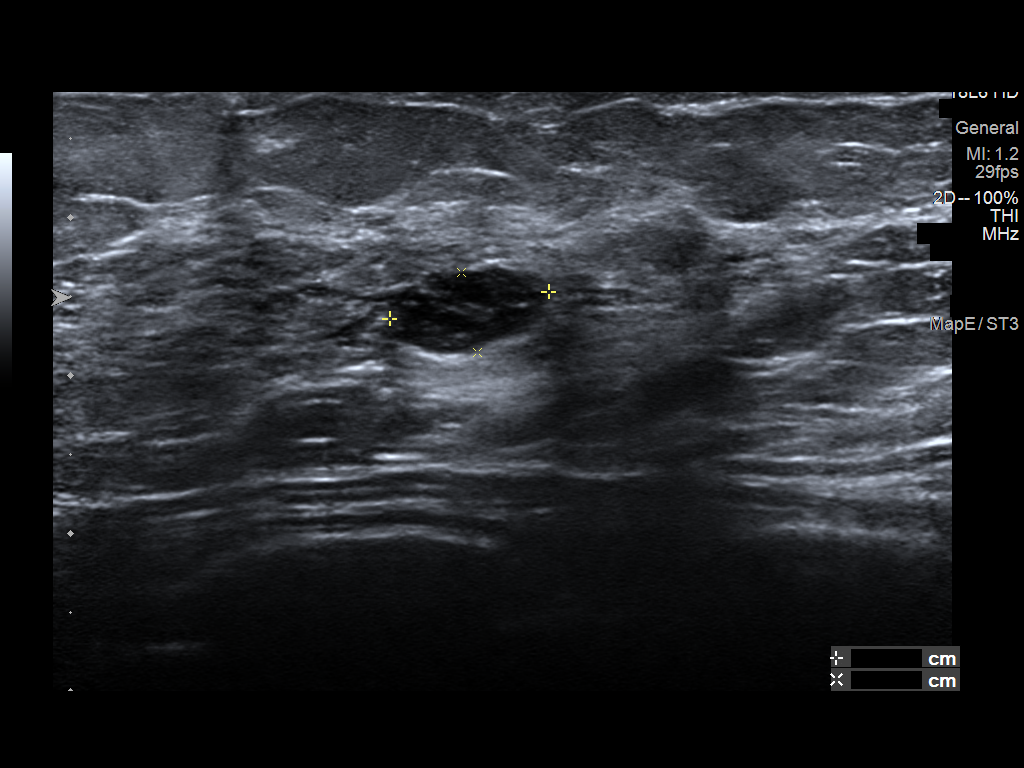
[im 3/5]
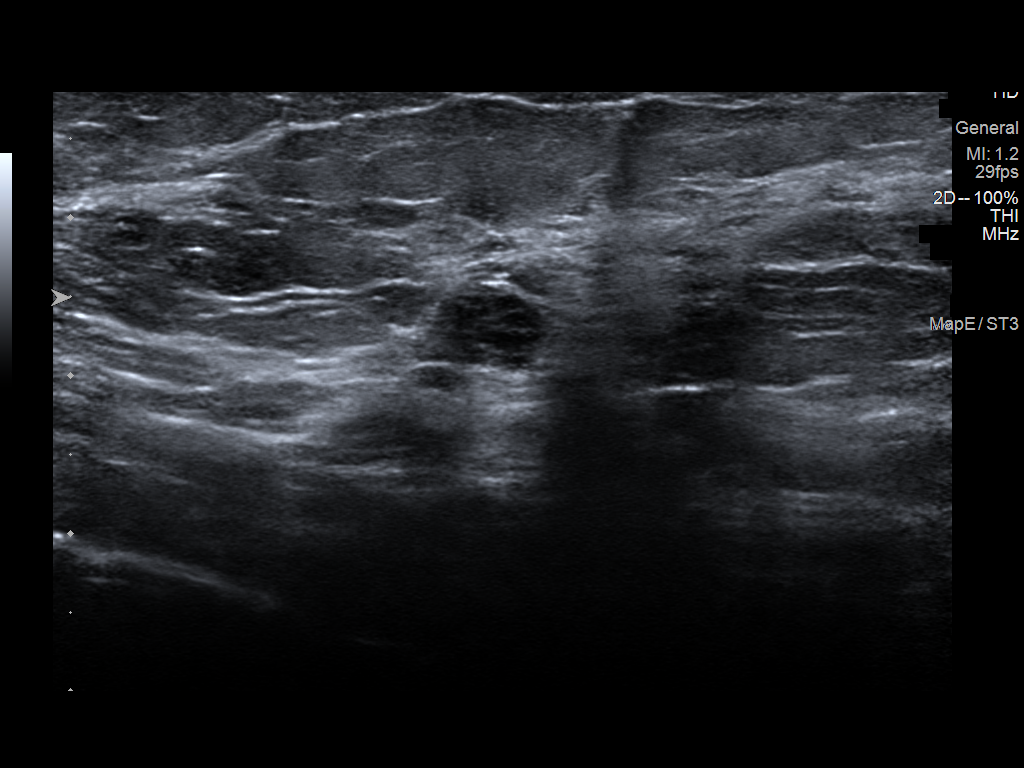
[im 4/5]
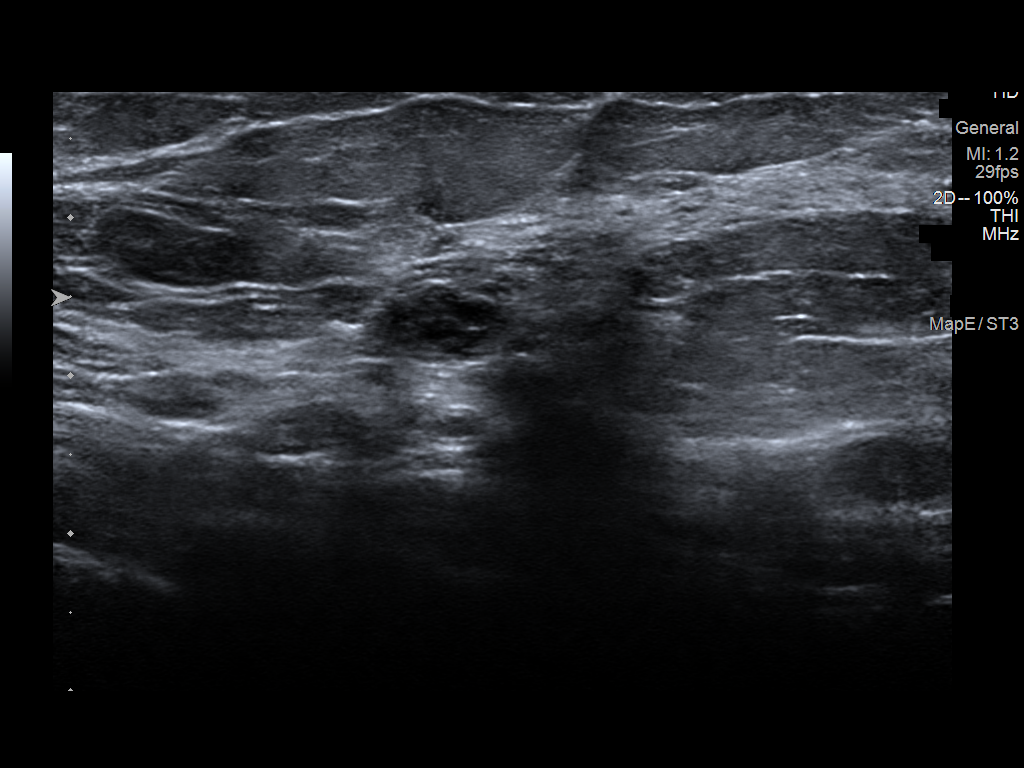
[im 5/5]
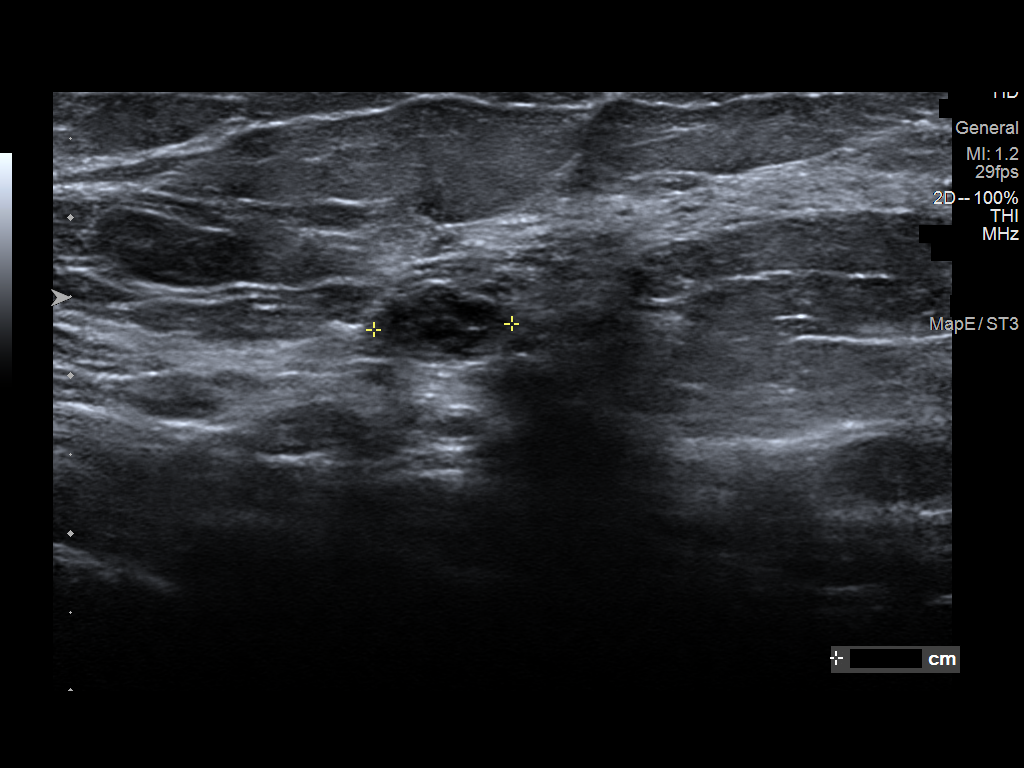

[5 of 5 positions shown; findings below may reference images not displayed]

ACR Breast Density Category b: There are scattered areas of
fibroglandular density.
FINDINGS: The previously described masses in both breast are less conspicuous
on today's exam. No suspicious mass or malignant type
microcalcifications identified in either breast.

Mammographic images were processed with CAD.

Targeted ultrasound is performed, showing a stable probable benign
cluster of cyst in the right breast at 9 o'clock 9 cm from the
nipple measuring 10 x 5 x 9 mm. On the prior exam dated 11/30/2018
it measured 10 x 6 x 9 mm. Sonographic evaluation of the subareolar
region of the left breast shows normal appearing ducts. No
intraductal mass identified.
IMPRESSION: Probable benign findings in both breast.

RECOMMENDATION:
Bilateral diagnostic mammogram and right breast ultrasound 6 months
is recommended. The patient is due for follow-up breast MRI in
Thursday June, 2019.

Concerning nipple discharge was discussed with the patient. If the
nipple discharge is clinically concerning surgical consultation
would be recommended.

I have discussed the findings and recommendations with the patient.
If applicable, a reminder letter will be sent to the patient
regarding the next appointment.

BI-RADS CATEGORY  3: Probably benign.

## 2022-03-17 ENCOUNTER — Ambulatory Visit
Admission: RE | Admit: 2022-03-17 | Discharge: 2022-03-17 | Disposition: A | Discharge status: Home / Self Care | Payer: BC Managed Care – PPO | Source: Ambulatory Visit | Attending: Physician Assistant | Admitting: Physician Assistant

## 2022-03-17 ENCOUNTER — Other Ambulatory Visit: Payer: Self-pay | Admitting: Physician Assistant

## 2022-03-17 DIAGNOSIS — Z09 Encounter for follow-up examination after completed treatment for conditions other than malignant neoplasm: Secondary | ICD-10-CM

## 2022-03-17 DIAGNOSIS — N63 Unspecified lump in unspecified breast: Secondary | ICD-10-CM

## 2022-03-27 ENCOUNTER — Encounter: Payer: Self-pay | Admitting: Diagnostic Radiology

## 2022-03-27 NOTE — Telephone Encounter (Signed)
Anne Eaton I wanted to check with you to see what you found out in talking to your primary care provider.  I would like to get you scheduled for your biopsies soon. If we don't do the biopsy now we should do a follow up in 6 months.  Please let me know your thoughts. Please call the [facility] at [phone] if you have any questions.    Thank you.

## 2022-04-17 ENCOUNTER — Telehealth: Payer: Self-pay | Admitting: Diagnostic Radiology

## 2022-04-17 NOTE — Telephone Encounter (Signed)
Called patient to discuss possible biopsy scheduling. At this time, patient is caring for her daughter and grandchild.  She plans to schedule biopsies in the New Year. She has not yet discussed medications with her provider.

## 2022-06-11 IMAGING — MG DIGITAL DIAGNOSTIC BILAT W/ TOMO W/ CAD
8 series · 8 of 24 positions shown · non-contrast
Comparison: 08/31/2019, 06/06/2019, and earlier

CLINICAL DATA: Patient returns for 1 year follow-up of probably
benign findings in both breasts. Patient had bilateral breast MRI
08/31/2019 which was negative.

EXAM:
DIGITAL DIAGNOSTIC BILATERAL MAMMOGRAM WITH CAD AND TOMO
ULTRASOUND RIGHT BREAST

[L MLO synth-2D]
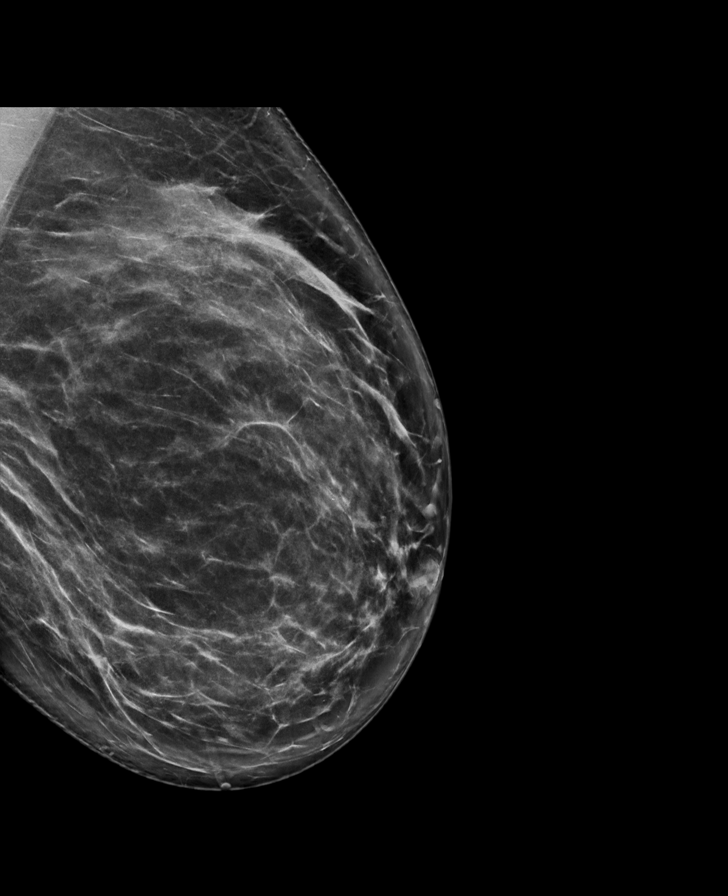

[R CC synth-2D]
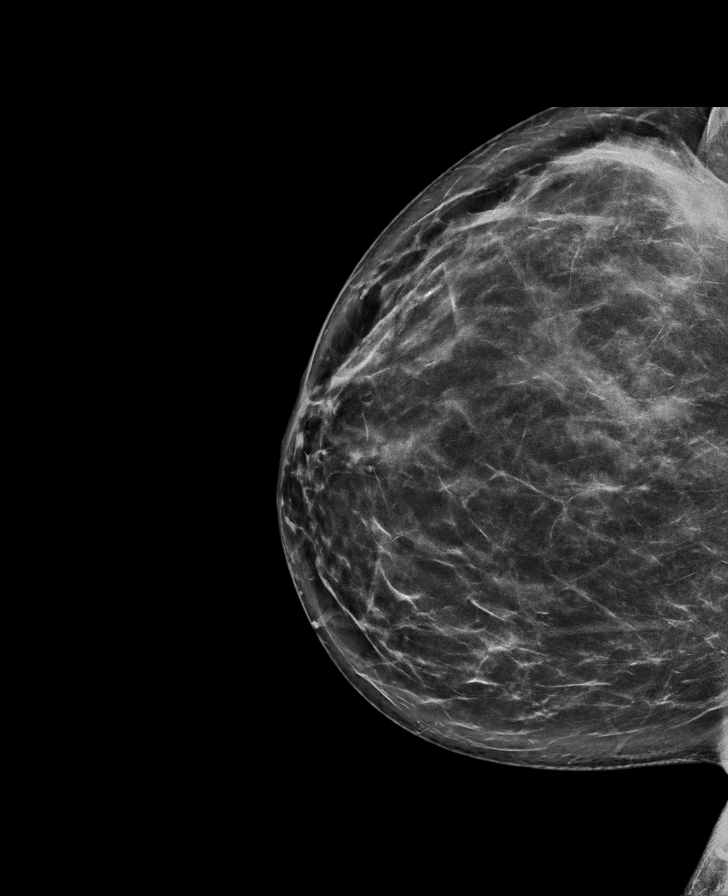

[L CC synth-2D]
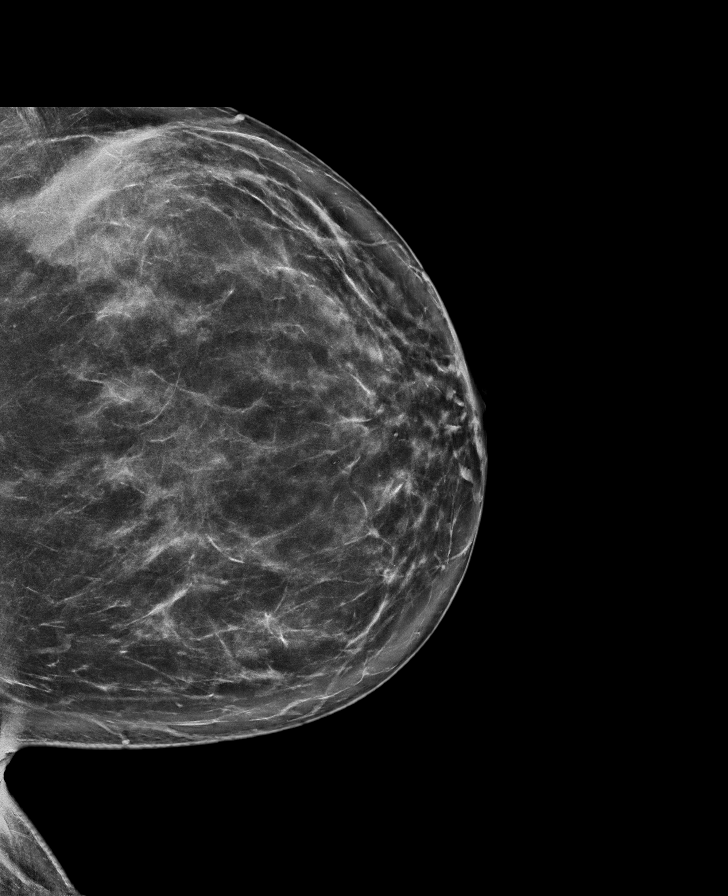

[R MLO synth-2D]
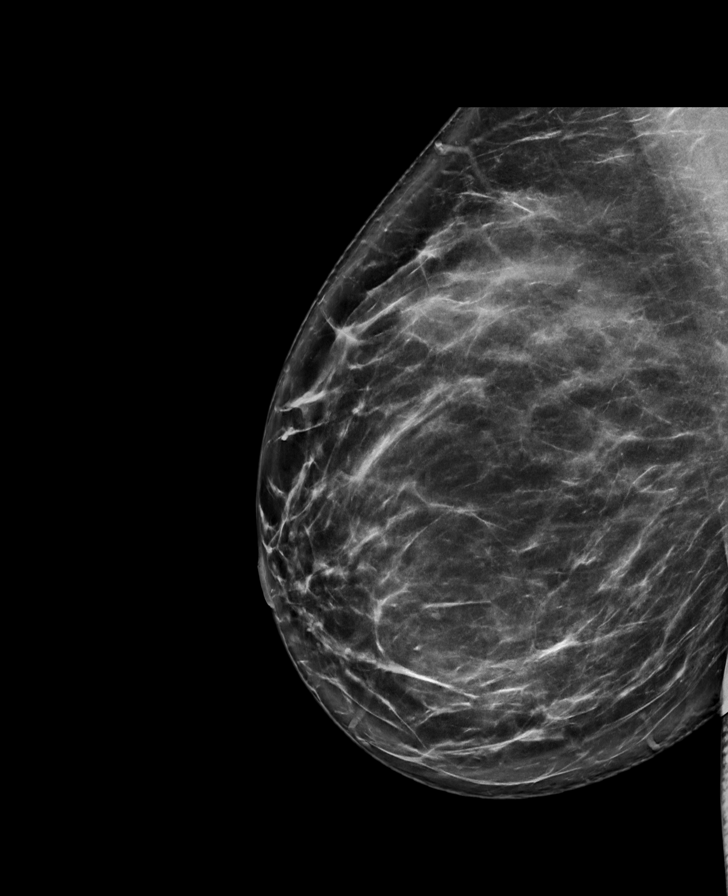

[L MLO tomo · tomo slice 50/99.0]
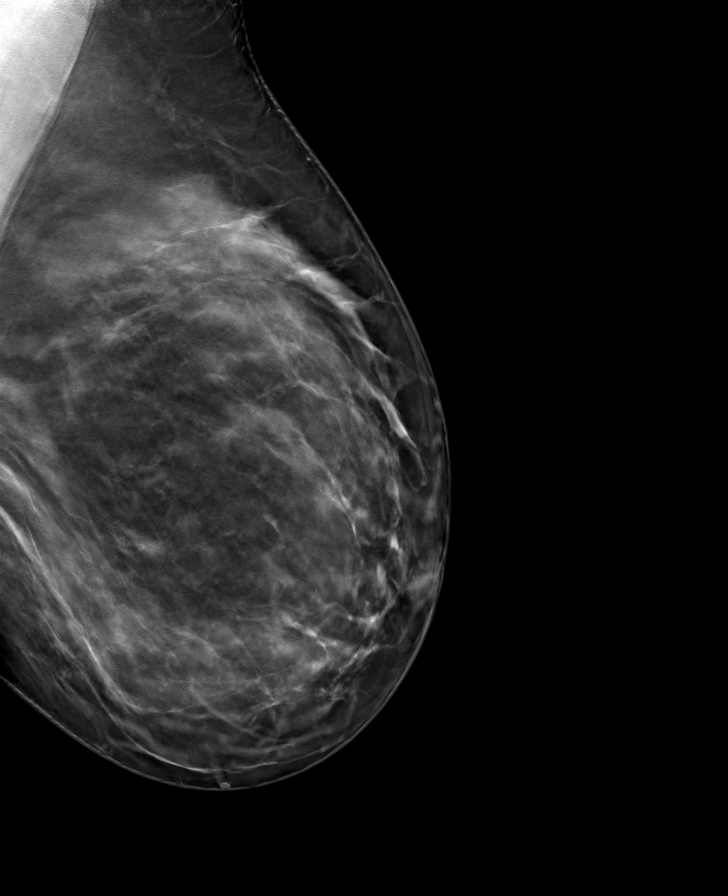

[L CC tomo · tomo slice 48/95.0]
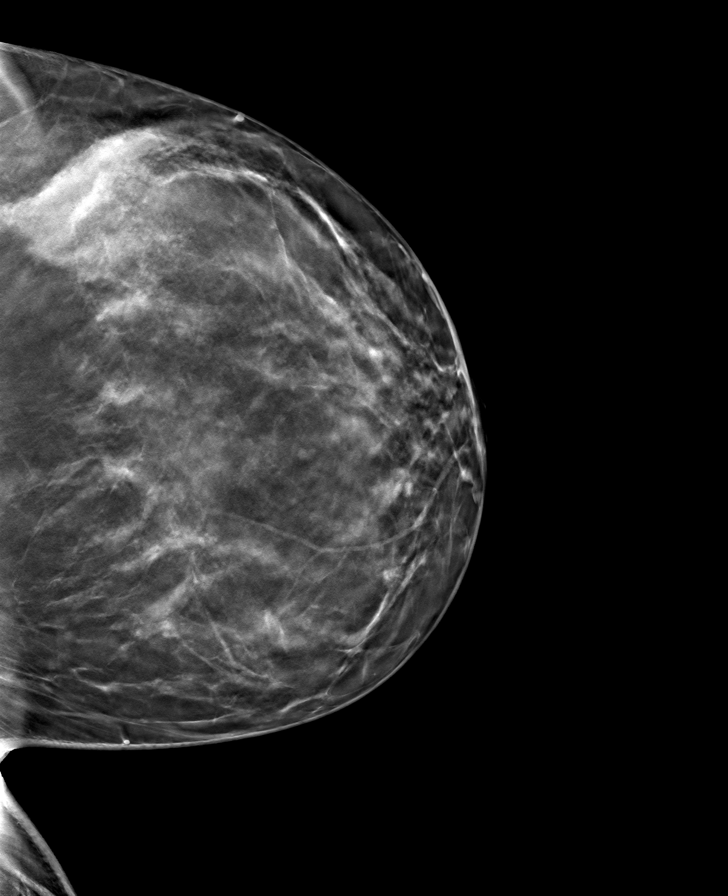

[R MLO tomo · tomo slice 51/100.0]
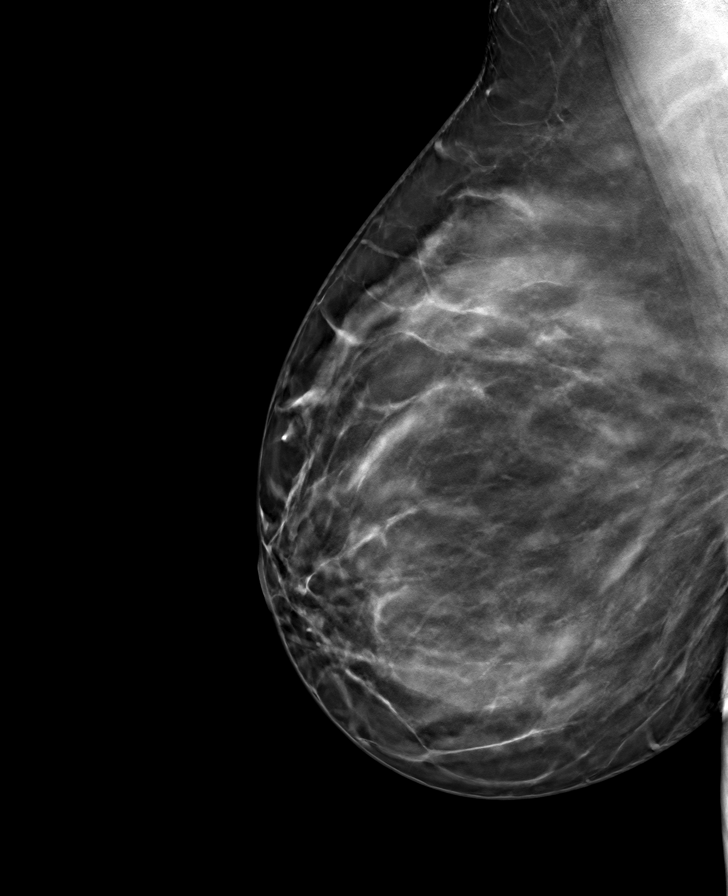

[R CC tomo · tomo slice 47/93.0]
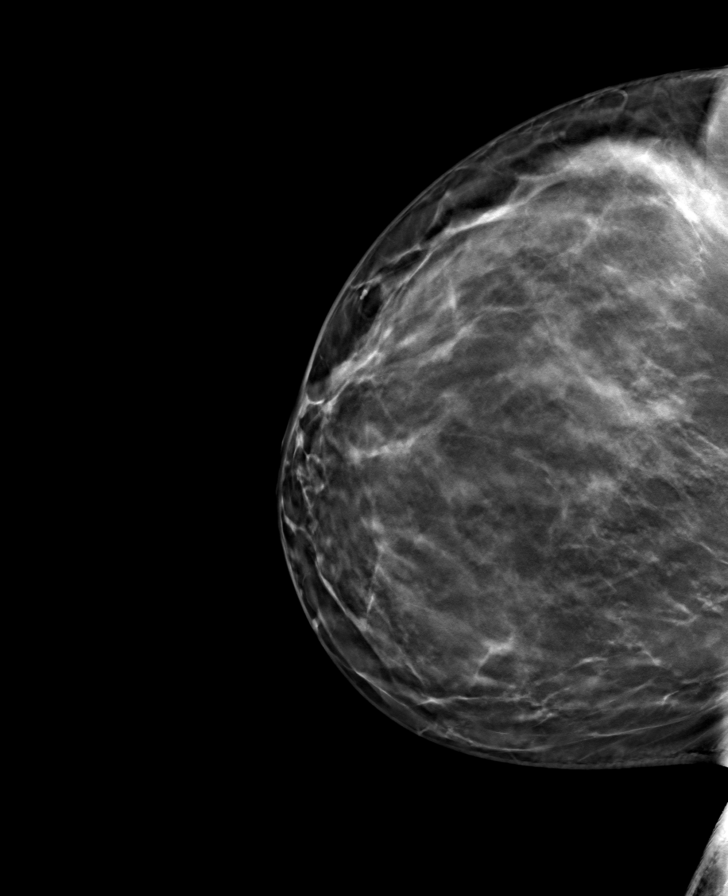

[8 of 24 positions shown; findings below may reference images not displayed]

ACR Breast Density Category c: The breast tissue is heterogeneously
dense, which may obscure small masses.
FINDINGS: RIGHT BREAST:

Mammogram: Mass previously noted in the posterior central portion of
the RIGHT breast on craniocaudal view is less apparent. No new or
suspicious finding in the RIGHT breast. Mammographic images were
processed with CAD.

Ultrasound: Targeted ultrasound is performed, showing a
circumscribed oval hypoechoic mass in the 9 o'clock location of the
RIGHT breast 9 centimeters from the nipple measuring 1.0 x 0.4 x
centimeters. No acoustic shadowing.

LEFT BREAST:

Mammogram: No suspicious mass, distortion, or microcalcifications
are identified to suggest presence of malignancy. Mass in the head
or central anterior portion of the LEFT breast is again noted on the
craniocaudal projection but is significantly smaller. Mammographic
images were processed with CAD.
IMPRESSION: 1. Improved appearance of small mass in the posterior central
portion of the RIGHT breast seen mammographically.
2. Stable appearance of circumscribed mass in the 9 o'clock location
of the RIGHT breast.
3. Smaller benign-appearing mass in the LEFT breast.

RECOMMENDATION:
Recommend bilateral diagnostic mammogram and RIGHT breast ultrasound
in 1 year to complete follow-up.

I have discussed the findings and recommendations with the patient.
If applicable, a reminder letter will be sent to the patient
regarding the next appointment.

BI-RADS CATEGORY  3: Probably benign.

## 2022-11-19 ENCOUNTER — Other Ambulatory Visit: Payer: Self-pay | Admitting: Family Medicine

## 2022-11-19 DIAGNOSIS — N6320 Unspecified lump in the left breast, unspecified quadrant: Secondary | ICD-10-CM

## 2022-11-26 ENCOUNTER — Ambulatory Visit
Admission: RE | Admit: 2022-11-26 | Discharge: 2022-11-26 | Disposition: A | Discharge status: Home / Self Care | Payer: BC Managed Care – PPO | Source: Ambulatory Visit | Attending: Family Medicine | Admitting: Family Medicine

## 2022-11-26 DIAGNOSIS — N6320 Unspecified lump in the left breast, unspecified quadrant: Secondary | ICD-10-CM

## 2022-11-28 ENCOUNTER — Other Ambulatory Visit: Payer: Self-pay | Admitting: Family Medicine

## 2022-11-28 DIAGNOSIS — N6320 Unspecified lump in the left breast, unspecified quadrant: Secondary | ICD-10-CM

## 2022-12-02 ENCOUNTER — Ambulatory Visit
Admission: RE | Admit: 2022-12-02 | Discharge: 2022-12-02 | Disposition: A | Discharge status: Home / Self Care | Payer: BC Managed Care – PPO | Source: Ambulatory Visit | Attending: Family Medicine | Admitting: Family Medicine

## 2022-12-02 DIAGNOSIS — N6320 Unspecified lump in the left breast, unspecified quadrant: Secondary | ICD-10-CM

## 2022-12-02 HISTORY — PX: BREAST BIOPSY: SHX20

## 2023-02-19 ENCOUNTER — Encounter: Payer: Self-pay | Admitting: Neurology

## 2023-02-19 ENCOUNTER — Ambulatory Visit (INDEPENDENT_AMBULATORY_CARE_PROVIDER_SITE_OTHER): Payer: BC Managed Care – PPO | Admitting: Neurology

## 2023-02-19 VITALS — BP 126/80 | Ht 63.0 in | Wt 226.0 lb

## 2023-02-19 DIAGNOSIS — R42 Dizziness and giddiness: Secondary | ICD-10-CM | POA: Diagnosis not present

## 2023-02-19 DIAGNOSIS — G43709 Chronic migraine without aura, not intractable, without status migrainosus: Secondary | ICD-10-CM | POA: Insufficient documentation

## 2023-02-19 MED ORDER — TOPIRAMATE 50 MG PO TABS: 100.0000 mg | ORAL_TABLET | Freq: Every evening | ORAL | 11 refills | Status: AC

## 2023-02-19 MED ORDER — SUMATRIPTAN SUCCINATE 50 MG PO TABS: ORAL_TABLET | ORAL | 5 refills | Status: AC

## 2023-02-19 NOTE — Progress Notes (Signed)
Chief Complaint  Patient presents with   New Patient (Initial Visit)    Rm 14, near passing out, dizziness      ASSESSMENT AND PLAN  Anne Eaton is a 37 y.o. female   Frequent head pressure, dizziness sensation,  Previous passing out spells,  Differentiation diagnosis including migraine, could not rule out the possibility of seizure  MRI of the brain with without from [facility] in September 2024 showed no acute abnormality, incidentally noted area of parenchymal volume loss involving right parietal lobe, radiologist suggested sequelae of prior insult such as ischemia, infection, inflammation, less likely trauma  Suggest bring MRI films to review at next visit  Topiramate 50 titrating to 100 mg every night as preventive medication  Imitrex as needed  Return To Clinic With NP In 6 Months   DIAGNOSTIC DATA (LABS, IMAGING, TESTING) - I reviewed patient records, labs, notes, testing and imaging myself where available.   MEDICAL HISTORY:  Anne Eaton is a 37 year old female, seen in request by her primary care from [facility] family medicine doctor Vivi Ferns, Jovita Gamma, for evaluation of passing out, dizziness, initial evaluation February 19, 2023  History is obtained from the patient and review of electronic medical records. I personally reviewed pertinent available imaging films in PACS.   PMHx of  GERD Asthma Hyperlipidemia  She presenting with frequent dizziness, sometimes to the point of passing out since 2020, this can happen multiple times a week, can be triggered by getting up, walking, driving or even standing, she learned to steady herself when she feels an episode coming  She also has frequent headaches, is usually happen when waking up, typically on the right side, can last up to 2 weeks, headache often preceded by head pressure sensation, tried Tylenol without helping her symptoms  MRI of the cervical spine in January 2022 showed mild degenerative  changes, most noticeable at C5-6, right paracentral protruding annular fissuring a body in the ventral cord, mild canal narrowing, but patent foraminal  MRI of the brain with without contrast from [facility] in September 2024, no acute intracranial abnormality, incidentally noted area of brain parenchymal volume loss involving posterior right parietal lobe, radiology reads the possibility of ischemia infection, inflammation, less likely due to trauma    PHYSICAL EXAM:   Vitals:   02/19/23 1430  BP: 126/80  Weight: 226 lb (102.5 kg)  Height: 5\' 3"  (1.6 m)      Body mass index is 40.03 kg/m.  PHYSICAL EXAMNIATION:  Gen: NAD, conversant, well nourised, well groomed                     Cardiovascular: Regular rate rhythm, no peripheral edema, warm, nontender. Eyes: Conjunctivae clear without exudates or hemorrhage Neck: Supple, no carotid bruits. Pulmonary: Clear to auscultation bilaterally   NEUROLOGICAL EXAM:  MENTAL STATUS: Speech/cognition: Depressed looking middle-age female awake, alert, oriented to history taking and casual conversation CRANIAL NERVES: CN II: Visual fields are full to confrontation. Pupils are round equal and briskly reactive to light.  Speech examination showed mild blurry edge bilaterally CN III, IV, VI: extraocular movement are normal. No ptosis. CN V: Facial sensation is intact to light touch CN VII: Face is symmetric with normal eye closure  CN VIII: Hearing is normal to causal conversation. CN IX, X: Phonation is normal. CN XI: Head turning and shoulder shrug are intact  MOTOR: There is no pronator drift of out-stretched arms. Muscle bulk and tone are normal. Muscle  strength is normal.  REFLEXES: Reflexes are 2+ and symmetric at the biceps, triceps, knees, and ankles. Plantar responses are flexor.  SENSORY: Intact to light touch, pinprick and vibratory sensation are intact in fingers and toes.  COORDINATION: There is no trunk or limb  dysmetria noted.  GAIT/STANCE: Posture is normal. Gait is steady with normal steps, base, arm swing, and turning. Heel and toe walking are normal. Tandem gait is normal.  Romberg is absent.  REVIEW OF SYSTEMS:  Full 14 system review of systems performed and notable only for as above All other review of systems were negative.   ALLERGIES: Allergies  Allergen Reactions   Augmentin [Amoxicillin-Pot Clavulanate] Other (See Comments)    Yeast infection   Cymbalta [Duloxetine Hcl] Other (See Comments)    Suicidal ideation and passing out   Morphine And Codeine Other (See Comments)    Made me crazy   Trazodone And Nefazodone     Headache     HOME MEDICATIONS: Current Outpatient Medications  Medication Sig Dispense Refill   famotidine (PEPCID) 40 MG tablet Take 40 mg by mouth daily.     fexofenadine-pseudoephedrine (ALLEGRA-D 24) 180-240 MG 24 hr tablet Take 1 tablet by mouth daily as needed. 30 tablet 3   pantoprazole (PROTONIX) 40 MG tablet Take 40 mg by mouth 2 (two) times daily.     albuterol (VENTOLIN HFA) 108 (90 Base) MCG/ACT inhaler Inhale 1-2 puffs into the lungs every 6 (six) hours as needed for wheezing or shortness of breath. 1 each 0   Budesonide (PULMICORT FLEXHALER) 90 MCG/ACT inhaler Inhale 2 puffs into the lungs 2 (two) times daily. 1 each 0   cetirizine (ZYRTEC ALLERGY) 10 MG tablet Take 1 tablet (10 mg total) by mouth daily. 30 tablet 0   Estradiol 0.75 MG/1.25 GM (0.06%) topical gel Place 1.25 g onto the skin daily. 50 g 2   gabapentin (NEURONTIN) 300 MG capsule Take 1 capsule (300 mg total) by mouth at bedtime. 30 capsule 3   hydrOXYzine (ATARAX/VISTARIL) 25 MG tablet Take by mouth.     omeprazole (PRILOSEC) 20 MG capsule Take 1 capsule (20 mg total) by mouth daily. 90 capsule 1   predniSONE (DELTASONE) 20 MG tablet Take 2 tablets (40 mg total) by mouth daily with breakfast. 10 tablet 0   pregabalin (LYRICA) 50 MG capsule PLEASE SEE ATTACHED FOR DETAILED DIRECTIONS      PREMARIN vaginal cream USE 1 GM IN VAGINA DAILY FOR 2 WEEKS THEN 2-3 X A WEEK 30 g 2   promethazine-dextromethorphan (PROMETHAZINE-DM) 6.25-15 MG/5ML syrup Take 5 mLs by mouth 4 (four) times daily as needed for cough. 100 mL 0   traMADol-acetaminophen (ULTRACET) 37.5-325 MG tablet TAKE 1 TABLET BY MOUTH EVERY 6 HOURS AS NEEDED 30 tablet 0   No current facility-administered medications for this visit.    PAST MEDICAL HISTORY: Past Medical History:  Diagnosis Date   Asthma    Hypercholesteremia    Hypoglycemia    Loss of consciousness (HCC)    Scoliosis     PAST SURGICAL HISTORY: Past Surgical History:  Procedure Laterality Date   ABDOMINAL HYSTERECTOMY     BREAST BIOPSY Left 12/02/2022   [place] LT BREAST BX W LOC DEV 1ST LESION IMG BX SPEC US GUIDE 12/02/2022 [facility]   CESAREAN SECTION     x 3   GALLBLADDER SURGERY     REDUCTION MAMMAPLASTY Bilateral 06/20/2020   TONSILLECTOMY     WISDOM TOOTH EXTRACTION  FAMILY HISTORY: Family History  Problem Relation Age of Onset   Basal cell carcinoma Mother    Hypertension Father    Atrial fibrillation Father    Hyperlipidemia Father    Colon cancer Maternal Grandmother    Breast cancer Maternal Grandmother    Varicose Veins Maternal Grandmother    Arthritis Maternal Grandfather    Diabetes Maternal Grandfather    Heart disease Maternal Grandfather    Heart failure Maternal Grandfather    Heart attack Maternal Grandfather    Hypertension Maternal Grandfather    Hyperlipidemia Maternal Grandfather    Arthritis Paternal Grandmother    COPD Paternal Grandmother    Hypertension Paternal Grandmother    Aortic stenosis Paternal Grandmother    Stroke Paternal Grandmother    Cataracts Paternal Grandmother    Macular degeneration Paternal Grandmother    Varicose Veins Paternal Grandmother    Diabetes Paternal Grandfather    Hypertension Paternal Grandfather    Heart attack Paternal Grandfather    Asthma Brother     ADD / ADHD Son    Asthma Son    ADD / ADHD Son    Asthma Son    ADD / ADHD Son    Asthma Son    Alcohol abuse Maternal Uncle    Arthritis Maternal Uncle    Diabetes Maternal Uncle    Hypertension Maternal Uncle    Hyperlipidemia Maternal Uncle    COPD Maternal Aunt    Sleep apnea Maternal Aunt    Diabetes Maternal Aunt    Heart disease Maternal Aunt    Lung cancer Maternal Uncle    Prostate cancer Paternal Uncle    Breast cancer Paternal Aunt    Obesity Paternal Aunt    Diabetes Paternal Uncle    Heart attack Paternal Uncle    Diabetes Paternal Uncle    Heart attack Paternal Uncle    Macular degeneration Paternal Uncle    Diabetes Paternal Uncle    Hearing loss Paternal Uncle    Diabetes Paternal Uncle    Diabetes Paternal Uncle    Obesity Paternal Uncle    Diabetes Paternal Aunt    Hypertension Maternal Aunt    Hypertension Maternal Aunt     SOCIAL HISTORY: Social History   Socioeconomic History   Marital status: Married    Spouse name: Not on file   Number of children: 3   Years of education: some college   Highest education level: Not on file  Occupational History   Occupation: Unemployed  Tobacco Use   Smoking status: Former    Current packs/day: 0.00    Types: Cigarettes    Quit date: 2020    Years since quitting: 4.7   Smokeless tobacco: Former   Tobacco comments:    0.5 - 1 ppd  Vaping Use   Vaping status: Some Days   Substances: Nicotine, Flavoring  Substance and Sexual Activity   Alcohol use: Yes    Comment: rarely   Drug use: Never   Sexual activity: Yes    Birth control/protection: Surgical    Comment: hyst  Other Topics Concern   Not on file  Social History Narrative   Lives at home with husband and stepdaughter.   Right-handed.   8-16 ounces caffeine per day.   Social Determinants of Health   Financial Resource Strain: Low Risk  (01/27/2020)   Overall Financial Resource Strain (CARDIA)    Difficulty of Paying Living Expenses: Not  hard at all  Food Insecurity: Low Risk  (  11/19/2022)   [facility]   Hunger Vital Sign    Worried About Running Out of Food in the Last Year: Never true    Ran Out of Food in the Last Year: Never true  Transportation Needs: No Transportation Needs (11/19/2022)   [facility]   Transportation    In the past 12 months, has lack of reliable transportation kept you from medical appointments, meetings, work or from getting things needed for daily living? : No  Physical Activity: Sufficiently Active (01/27/2020)   Exercise Vital Sign    Days of Exercise per Week: 7 days    Minutes of Exercise per Session: 60 min  Stress: Stress Concern Present (01/27/2020)   [employer] of Occupational Health - Occupational Stress Questionnaire    Feeling of Stress : Rather much  Social Connections: Moderately Isolated (01/27/2020)   Social Connection and Isolation Panel [NHANES]    Frequency of Communication with Friends and Family: More than three times a week    Frequency of Social Gatherings with Friends and Family: Never    Attends Religious Services: Never    Database administrator or Organizations: No    Attends Banker Meetings: Never    Marital Status: Married  Catering manager Violence: Not At Risk (01/27/2020)   Humiliation, Afraid, Rape, and Kick questionnaire    Fear of Current or Ex-Partner: No    Emotionally Abused: No    Physically Abused: No    Sexually Abused: No      Levert Feinstein, M.D. Ph.D.  [facility] [address], [address] [place], [place] [address] Ph: [phone] Fax: [phone]  CC:  Mattie Marlin, DO [address] [place],  [place] [address]  [facility]

## 2023-03-16 ENCOUNTER — Other Ambulatory Visit: Payer: Self-pay | Admitting: Family Medicine

## 2023-03-16 DIAGNOSIS — L988 Other specified disorders of the skin and subcutaneous tissue: Secondary | ICD-10-CM

## 2023-03-23 ENCOUNTER — Other Ambulatory Visit: Payer: Self-pay | Admitting: Family Medicine

## 2023-03-23 DIAGNOSIS — L988 Other specified disorders of the skin and subcutaneous tissue: Secondary | ICD-10-CM

## 2023-03-25 ENCOUNTER — Ambulatory Visit (INDEPENDENT_AMBULATORY_CARE_PROVIDER_SITE_OTHER): Payer: BC Managed Care – PPO | Admitting: Neurology

## 2023-03-25 DIAGNOSIS — R410 Disorientation, unspecified: Secondary | ICD-10-CM

## 2023-03-25 DIAGNOSIS — R42 Dizziness and giddiness: Secondary | ICD-10-CM

## 2023-03-25 DIAGNOSIS — G43709 Chronic migraine without aura, not intractable, without status migrainosus: Secondary | ICD-10-CM

## 2023-04-01 ENCOUNTER — Telehealth: Payer: Self-pay | Admitting: Neurology

## 2023-04-01 NOTE — Telephone Encounter (Signed)
Pt called to check on status of EEG results.

## 2023-04-07 NOTE — Procedures (Signed)
   HISTORY: 37 year old female with frequent dizziness  TECHNIQUE:  This is a routine 16 channel EEG recording with one channel devoted to a limited EKG recording.  It was performed during wakefulness, drowsiness and asleep.  Hyperventilation and photic stimulation were performed as activating procedures.  There are minimum muscle and movement artifact noted.  Upon maximum arousal, posterior dominant waking rhythm consistent of rhythmic alpha range activity. Activities are symmetric over the bilateral posterior derivations and attenuated with eye opening.  Photic stimulation did not alter the tracing.  Hyperventilation produced mild/moderate buildup with higher amplitude and the slower activities noted.  During EEG recording, patient developed drowsiness and entered sleep, sleep EEG demonstrated architecture, there were frontal centrally dominant vertex waves and symmetric sleep spindles noted.  During EEG recording, there was no epileptiform discharge noted.  EKG demonstrate normal sinus rhythm.  CONCLUSION: This is a  normal awake and asleep EEG.  There is no electrodiagnostic evidence of epileptiform discharge.  Levert Feinstein, M.D. Ph.D.  [facility] [address] [place], [place] [address] Phone: [phone] Fax:      [phone]

## 2023-04-29 ENCOUNTER — Other Ambulatory Visit: Payer: BC Managed Care – PPO

## 2023-06-05 ENCOUNTER — Other Ambulatory Visit: Payer: BC Managed Care – PPO

## 2023-07-03 ENCOUNTER — Other Ambulatory Visit: Payer: BC Managed Care – PPO

## 2023-07-03 ENCOUNTER — Ambulatory Visit
Admission: RE | Admit: 2023-07-03 | Discharge: 2023-07-03 | Disposition: A | Discharge status: Home / Self Care | Payer: BC Managed Care – PPO | Source: Ambulatory Visit | Attending: Family Medicine | Admitting: Family Medicine

## 2023-07-03 ENCOUNTER — Ambulatory Visit: Payer: BC Managed Care – PPO

## 2023-07-03 DIAGNOSIS — L988 Other specified disorders of the skin and subcutaneous tissue: Secondary | ICD-10-CM

## 2023-09-14 ENCOUNTER — Ambulatory Visit: Payer: BC Managed Care – PPO | Admitting: Adult Health
